# Patient Record
Sex: Male | Born: 1970 | Race: Black or African American | Hispanic: No | Smoking: Former smoker
Health system: Southern US, Community
[De-identification: ages and names within clinical notes are randomized; demographics above are authoritative.]

## PROBLEM LIST (undated history)

## (undated) DIAGNOSIS — I10 Essential (primary) hypertension: Secondary | ICD-10-CM

## (undated) DIAGNOSIS — G2581 Restless legs syndrome: Secondary | ICD-10-CM

## (undated) DIAGNOSIS — E119 Type 2 diabetes mellitus without complications: Secondary | ICD-10-CM

## (undated) DIAGNOSIS — E781 Pure hyperglyceridemia: Secondary | ICD-10-CM

## (undated) HISTORY — DX: Type 2 diabetes mellitus without complications: E11.9

## (undated) HISTORY — DX: Pure hyperglyceridemia: E78.1

## (undated) HISTORY — DX: Essential (primary) hypertension: I10

## (undated) HISTORY — DX: Restless legs syndrome: G25.81

---

## 1998-10-16 ENCOUNTER — Emergency Department (HOSPITAL_COMMUNITY): Admission: EM | Admit: 1998-10-16 | Discharge: 1998-10-16 | Payer: Self-pay | Admitting: Emergency Medicine

## 1999-01-15 ENCOUNTER — Emergency Department (HOSPITAL_COMMUNITY): Admission: EM | Admit: 1999-01-15 | Discharge: 1999-01-15 | Payer: Self-pay | Admitting: Emergency Medicine

## 1999-01-15 ENCOUNTER — Encounter: Payer: Self-pay | Admitting: Emergency Medicine

## 1999-02-03 ENCOUNTER — Emergency Department (HOSPITAL_COMMUNITY): Admission: EM | Admit: 1999-02-03 | Discharge: 1999-02-03 | Payer: Self-pay | Admitting: Emergency Medicine

## 1999-08-03 ENCOUNTER — Emergency Department (HOSPITAL_COMMUNITY): Admission: EM | Admit: 1999-08-03 | Discharge: 1999-08-03 | Payer: Self-pay | Admitting: Emergency Medicine

## 2000-06-13 ENCOUNTER — Emergency Department (HOSPITAL_COMMUNITY): Admission: EM | Admit: 2000-06-13 | Discharge: 2000-06-13 | Payer: Self-pay | Admitting: Internal Medicine

## 2000-06-17 ENCOUNTER — Emergency Department (HOSPITAL_COMMUNITY): Admission: EM | Admit: 2000-06-17 | Discharge: 2000-06-17 | Payer: Self-pay | Admitting: Emergency Medicine

## 2000-06-19 ENCOUNTER — Emergency Department (HOSPITAL_COMMUNITY): Admission: EM | Admit: 2000-06-19 | Discharge: 2000-06-19 | Payer: Self-pay | Admitting: Emergency Medicine

## 2000-07-21 ENCOUNTER — Emergency Department (HOSPITAL_COMMUNITY): Admission: EM | Admit: 2000-07-21 | Discharge: 2000-07-21 | Payer: Self-pay | Admitting: Emergency Medicine

## 2000-08-01 ENCOUNTER — Emergency Department (HOSPITAL_COMMUNITY): Admission: EM | Admit: 2000-08-01 | Discharge: 2000-08-01 | Payer: Self-pay | Admitting: Emergency Medicine

## 2002-01-18 ENCOUNTER — Emergency Department (HOSPITAL_COMMUNITY): Admission: EM | Admit: 2002-01-18 | Discharge: 2002-01-18 | Payer: Self-pay | Admitting: Emergency Medicine

## 2002-08-21 ENCOUNTER — Emergency Department (HOSPITAL_COMMUNITY): Admission: EM | Admit: 2002-08-21 | Discharge: 2002-08-21 | Payer: Self-pay | Admitting: Emergency Medicine

## 2003-10-02 ENCOUNTER — Emergency Department (HOSPITAL_COMMUNITY): Admission: EM | Admit: 2003-10-02 | Discharge: 2003-10-02 | Payer: Self-pay | Admitting: Emergency Medicine

## 2004-06-10 ENCOUNTER — Emergency Department (HOSPITAL_COMMUNITY): Admission: EM | Admit: 2004-06-10 | Discharge: 2004-06-10 | Payer: Self-pay | Admitting: Emergency Medicine

## 2007-10-13 ENCOUNTER — Emergency Department (HOSPITAL_COMMUNITY): Admission: EM | Admit: 2007-10-13 | Discharge: 2007-10-13 | Payer: Self-pay | Admitting: Emergency Medicine

## 2007-11-02 ENCOUNTER — Encounter: Admission: RE | Admit: 2007-11-02 | Discharge: 2007-11-02 | Payer: Self-pay | Admitting: Family Medicine

## 2011-09-29 ENCOUNTER — Ambulatory Visit (INDEPENDENT_AMBULATORY_CARE_PROVIDER_SITE_OTHER): Payer: BC Managed Care – PPO | Admitting: Family Medicine

## 2011-09-29 ENCOUNTER — Encounter: Payer: Self-pay | Admitting: Family Medicine

## 2011-09-29 VITALS — BP 130/80 | HR 72 | Temp 98.8°F | Ht 74.0 in | Wt 256.0 lb

## 2011-09-29 DIAGNOSIS — Z0000 Encounter for general adult medical examination without abnormal findings: Secondary | ICD-10-CM

## 2011-09-29 MED ORDER — HYDROCORTISONE 2.5 % RE CREA: TOPICAL_CREAM | Freq: Two times a day (BID) | RECTAL | Status: AC

## 2011-09-30 ENCOUNTER — Encounter: Payer: Self-pay | Admitting: Family Medicine

## 2011-09-30 ENCOUNTER — Other Ambulatory Visit (INDEPENDENT_AMBULATORY_CARE_PROVIDER_SITE_OTHER): Payer: BC Managed Care – PPO

## 2011-09-30 DIAGNOSIS — Z0000 Encounter for general adult medical examination without abnormal findings: Secondary | ICD-10-CM

## 2011-09-30 LAB — BASIC METABOLIC PANEL
Calcium: 8.9 mg/dL (ref 8.4–10.5)
GFR: 100.23 mL/min (ref >60.00)
Sodium: 139 mEq/L (ref 135–145)

## 2011-09-30 LAB — CBC WITH DIFFERENTIAL/PLATELET
Basophils Relative: 0.5 % (ref 0.0–3.0)
Eosinophils Absolute: 0.2 10*3/uL (ref 0.0–0.7)
Hemoglobin: 14.6 g/dL (ref 13.0–17.0)
MCHC: 33.3 g/dL (ref 30.0–36.0)
MCV: 96.4 fl (ref 78.0–100.0)
Monocytes Absolute: 0.5 10*3/uL (ref 0.1–1.0)
Neutro Abs: 2.6 10*3/uL (ref 1.4–7.7)
RBC: 4.53 Mil/uL (ref 4.22–5.81)

## 2011-09-30 LAB — LIPID PANEL
HDL: 39.6 mg/dL (ref >39.00)
Total CHOL/HDL Ratio: 5
VLDL: 22.4 mg/dL (ref 0.0–40.0)

## 2011-09-30 LAB — HEPATIC FUNCTION PANEL
Alkaline Phosphatase: 61 U/L (ref 39–117)
Bilirubin, Direct: 0 mg/dL (ref 0.0–0.3)

## 2011-09-30 NOTE — Progress Notes (Signed)
Quick Note:  I tried to reach pt by phone, no answer. I put a copy of results in mail. ______ 

## 2011-09-30 NOTE — Progress Notes (Signed)
  Subjective:    Patient ID: Jeffrey Todd, male    DOB: 08-01-1970, 41 y.o.   MRN: [id number]  HPI 41 yr old male to establish and for a cpx. He feels fine except for 2 issues. He has a long hx of hemorrhoids which swell and get painful from time to time. They may bleed on occasion. OTC meds like Preparation H do not help much. His BMs are easy to pass with no straining, and they are soft and not hard. However he does describe having frequent BMs, often 3 or 4 a day. He passes a lot of flatus and has a lot of bloating. No heartburn or GERD symptoms. Appetite is good. He thinks he is lactose intolerant because consuming a lot of dairy products causes more bloating and some diarrhea.    Review of Systems  Constitutional: Negative.   HENT: Negative.   Eyes: Negative.   Respiratory: Negative.   Cardiovascular: Negative.   Gastrointestinal: Positive for diarrhea, abdominal distention, anal bleeding and rectal pain. Negative for nausea, vomiting, abdominal pain, constipation and blood in stool.  Genitourinary: Negative.   Musculoskeletal: Negative.   Skin: Negative.   Neurological: Negative.   Hematological: Negative.   Psychiatric/Behavioral: Negative.        Objective:   Physical Exam  Constitutional: He is oriented to person, place, and time. He appears well-developed and well-nourished. No distress.  HENT:  Head: Normocephalic and atraumatic.  Right Ear: External ear normal.  Left Ear: External ear normal.  Nose: Nose normal.  Mouth/Throat: Oropharynx is clear and moist. No oropharyngeal exudate.  Eyes: Conjunctivae and EOM are normal. Pupils are equal, round, and reactive to light. Right eye exhibits no discharge. Left eye exhibits no discharge. No scleral icterus.  Neck: Neck supple. No JVD present. No tracheal deviation present. No thyromegaly present.  Cardiovascular: Normal rate, regular rhythm, normal heart sounds and intact distal pulses.  Exam reveals no gallop and no  friction rub.   No murmur heard. Pulmonary/Chest: Effort normal and breath sounds normal. No respiratory distress. He has no wheezes. He has no rales. He exhibits no tenderness.  Abdominal: Soft. Bowel sounds are normal. He exhibits no distension and no mass. There is no tenderness. There is no rebound and no guarding.  Genitourinary: Prostate normal and penis normal. Guaiac negative stool. No penile tenderness.       Several small non-tender external hemorrhoids   Musculoskeletal: Normal range of motion. He exhibits no edema and no tenderness.  Lymphadenopathy:    He has no cervical adenopathy.  Neurological: He is alert and oriented to person, place, and time. He has normal reflexes. No cranial nerve deficit. He exhibits normal muscle tone. Coordination normal.  Skin: Skin is warm and dry. No rash noted. He is not diaphoretic. No erythema. No pallor.  Psychiatric: He has a normal mood and affect. His behavior is normal. Judgment and thought content normal.          Assessment & Plan:  Well exam. Set up fasting labs soon. He will limit his intake of dairy products. He may have some IBS. Try taking Align probiotics daily. Use Anusol HC prn for the hemorrhoids

## 2011-12-07 ENCOUNTER — Ambulatory Visit: Payer: BC Managed Care – PPO | Admitting: Family Medicine

## 2011-12-14 ENCOUNTER — Ambulatory Visit (INDEPENDENT_AMBULATORY_CARE_PROVIDER_SITE_OTHER): Payer: BC Managed Care – PPO | Admitting: Family Medicine

## 2011-12-14 ENCOUNTER — Encounter: Payer: Self-pay | Admitting: Family Medicine

## 2011-12-14 VITALS — BP 140/90 | Temp 98.3°F | Wt 256.0 lb

## 2011-12-14 DIAGNOSIS — R51 Headache: Secondary | ICD-10-CM

## 2011-12-14 DIAGNOSIS — R030 Elevated blood-pressure reading, without diagnosis of hypertension: Secondary | ICD-10-CM

## 2011-12-14 DIAGNOSIS — IMO0001 Reserved for inherently not codable concepts without codable children: Secondary | ICD-10-CM

## 2011-12-14 MED ORDER — CYCLOBENZAPRINE HCL 10 MG PO TABS: 10.0000 mg | ORAL_TABLET | Freq: Three times a day (TID) | ORAL | Status: AC | PRN

## 2011-12-14 MED ORDER — DICLOFENAC SODIUM 75 MG PO TBEC: 75.0000 mg | DELAYED_RELEASE_TABLET | Freq: Two times a day (BID) | ORAL | Status: AC | PRN

## 2011-12-14 NOTE — Progress Notes (Signed)
  Subjective:    Patient ID: Jeffrey Todd, male    DOB: 05-09-1970, 41 y.o.   MRN: [id number]  HPI Here for 2 weeks of intermittent mild HAs in the back of the head. He also has stiffness in the neck lately. 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 No vision problems. Advil makes the HAs stop. He does not have a hx of HTN.    Review of Systems  Constitutional: Negative.   Eyes: Negative.   Cardiovascular: Negative.   Neurological: Positive for headaches.       Objective:   Physical Exam  Constitutional: He is oriented to person, place, and time. He appears well-developed and well-nourished.  Eyes: Pupils are equal, round, and reactive to light.  Cardiovascular: Normal rate, regular rhythm, normal heart sounds and intact distal pulses.   Pulmonary/Chest: Effort normal and breath sounds normal.  Neurological: He is alert and oriented to person, place, and time.          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 Try Diclofenac and Flexeril prn. I do not think these are related to BP but I asked him to monitor this closely.

## 2012-01-11 ENCOUNTER — Ambulatory Visit (INDEPENDENT_AMBULATORY_CARE_PROVIDER_SITE_OTHER): Payer: BC Managed Care – PPO | Admitting: Family Medicine

## 2012-01-11 ENCOUNTER — Encounter: Payer: Self-pay | Admitting: Family Medicine

## 2012-01-11 VITALS — BP 136/88 | HR 75 | Temp 98.7°F | Wt 258.0 lb

## 2012-01-11 DIAGNOSIS — R739 Hyperglycemia, unspecified: Secondary | ICD-10-CM

## 2012-01-11 DIAGNOSIS — R51 Headache: Secondary | ICD-10-CM

## 2012-01-11 DIAGNOSIS — R7309 Other abnormal glucose: Secondary | ICD-10-CM

## 2012-01-11 LAB — BASIC METABOLIC PANEL
Calcium: 9.2 mg/dL (ref 8.4–10.5)
Creatinine, Ser: 1.1 mg/dL (ref 0.4–1.5)
GFR: 92.96 mL/min (ref >60.00)
Glucose, Bld: 100 mg/dL — ABNORMAL HIGH (ref 70–99)
Sodium: 136 mEq/L (ref 135–145)

## 2012-01-11 NOTE — Progress Notes (Signed)
  Subjective:    Patient ID: Jeffrey Todd, male    DOB: [birth year], 41 y.o.   MRN: [id number]  HPI Here to follow up on HAs. We saw him last month for these and prescribed flexeril and Diclofenac. The Diclofenac helps for awhile but the HAs comes back . He cannot take Flexeril at all during the work week because of the sedation it causes. No blurred vision or nausea or neurologic deficits. The HAs are still in the back of the head and sometimes on the top of the head.   Review of Systems  Constitutional: Negative.   Eyes: Negative.   Respiratory: Negative.   Cardiovascular: Negative.   Neurological: Positive for headaches. Negative for dizziness, tremors, seizures, syncope, facial asymmetry, speech difficulty, weakness, light-headedness and numbness.       Objective:   Physical Exam  Constitutional: He is oriented to person, place, and time. He appears well-developed and well-nourished.  HENT:  Head: Normocephalic and atraumatic.  Right Ear: External ear normal.  Left Ear: External ear normal.  Nose: Nose normal.  Mouth/Throat: Oropharynx is clear and moist.  Eyes: Conjunctivae and EOM are normal. Pupils are equal, round, and reactive to light.  Neck: Normal range of motion. Neck supple. No thyromegaly present.  Lymphadenopathy:    He has no cervical adenopathy.  Neurological: He is alert and oriented to person, place, and time. He has normal reflexes. No cranial nerve deficit. He exhibits normal muscle tone. Coordination normal.          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 Stay on Flexeril and Diclofenac but take these regularly and not just prn. Set up a head CT soon. Written out of work today thorough 01-13-12.

## 2012-01-12 ENCOUNTER — Ambulatory Visit (INDEPENDENT_AMBULATORY_CARE_PROVIDER_SITE_OTHER)
Admission: RE | Admit: 2012-01-12 | Discharge: 2012-01-12 | Disposition: A | Discharge status: Home / Self Care | Payer: BC Managed Care – PPO | Source: Ambulatory Visit | Attending: Family Medicine | Admitting: Family Medicine

## 2012-01-12 DIAGNOSIS — R51 Headache: Secondary | ICD-10-CM

## 2012-01-13 ENCOUNTER — Telehealth: Payer: Self-pay | Admitting: Family Medicine

## 2012-01-13 NOTE — Telephone Encounter (Signed)
Normal (see the report)

## 2012-01-13 NOTE — Telephone Encounter (Signed)
Pt requesting results of CT scan done on yesterday.

## 2012-01-13 NOTE — Progress Notes (Signed)
Quick Note:  I spoke with pt ______ 

## 2012-01-13 NOTE — Telephone Encounter (Signed)
I spoke with pt and gave results of scan and labs.

## 2012-09-23 ENCOUNTER — Ambulatory Visit (INDEPENDENT_AMBULATORY_CARE_PROVIDER_SITE_OTHER): Payer: BC Managed Care – PPO | Admitting: Family Medicine

## 2012-09-23 ENCOUNTER — Encounter: Payer: Self-pay | Admitting: Family Medicine

## 2012-09-23 VITALS — BP 154/88 | HR 77 | Temp 98.9°F | Wt 261.0 lb

## 2012-09-23 DIAGNOSIS — Z2089 Contact with and (suspected) exposure to other communicable diseases: Secondary | ICD-10-CM

## 2012-09-23 DIAGNOSIS — K649 Unspecified hemorrhoids: Secondary | ICD-10-CM

## 2012-09-23 DIAGNOSIS — Z202 Contact with and (suspected) exposure to infections with a predominantly sexual mode of transmission: Secondary | ICD-10-CM

## 2012-09-23 MED ORDER — METRONIDAZOLE 500 MG PO TABS: 500.0000 mg | ORAL_TABLET | Freq: Three times a day (TID) | ORAL | Status: DC

## 2012-09-23 NOTE — Progress Notes (Signed)
  Subjective:    Patient ID: Jeffrey Todd, male    DOB: November 12, 1970, 42 y.o.   MRN: [id number]  HPI Here to ask about hemorrhoids. His are getting worse and now he bleeds with every BM. Also his girlfriend was recently diagnosed with Trichomonas, and he was told to see me. He has no symptoms.    Review of Systems  Constitutional: Negative.   Gastrointestinal: Positive for anal bleeding.       Objective:   Physical Exam  Constitutional: He appears well-developed and well-nourished.          Assessment & Plan:  Refer to Surgery for the hemorrhoids. Cover with Flagyl.

## 2012-10-03 ENCOUNTER — Ambulatory Visit (INDEPENDENT_AMBULATORY_CARE_PROVIDER_SITE_OTHER): Payer: BC Managed Care – PPO | Admitting: General Surgery

## 2012-10-03 ENCOUNTER — Encounter (INDEPENDENT_AMBULATORY_CARE_PROVIDER_SITE_OTHER): Payer: Self-pay | Admitting: General Surgery

## 2012-10-03 VITALS — BP 130/76 | HR 68 | Temp 97.6°F | Resp 16 | Ht 74.0 in | Wt 260.8 lb

## 2012-10-03 DIAGNOSIS — K649 Unspecified hemorrhoids: Secondary | ICD-10-CM

## 2012-10-03 DIAGNOSIS — K648 Other hemorrhoids: Secondary | ICD-10-CM

## 2012-10-03 MED ORDER — HYDROCORTISONE ACETATE 25 MG RE SUPP: 25.0000 mg | Freq: Every day | RECTAL | Status: DC

## 2012-10-03 NOTE — Progress Notes (Signed)
Chief Complaint  Patient presents with  . New Evaluation    eval hems    HISTORY: Jeffrey Todd is a 42 y.o. male who presents to the office with rectal bleeding with every BM for the last few weeks.  Other symptoms include prolapsing tissue.  This had been occurring for several years intermittently.  He has tried hemorrhoid cream in the past with some success but doesn't help now.  Nothing makes the symptoms worse.   It is continuous with bm's at the present.  His bowel habits are regular and his bowel movements are soft.  His fiber intake is min.  He has never had a colonoscopy.     Past Medical History  Diagnosis Date  . Hemorrhoids       History reviewed. No pertinent past surgical history.      Current Outpatient Prescriptions  Medication Sig Dispense Refill  . aspirin 81 MG tablet Take 81 mg by mouth daily.      . cyclobenzaprine (FLEXERIL) 10 MG tablet Take 10 mg by mouth 3 (three) times daily as needed.      . diclofenac (VOLTAREN) 75 MG EC tablet Take 1 tablet (75 mg total) by mouth 2 (two) times daily as needed (pain).  60 tablet  2  . hydrocortisone (ANUSOL-HC) 25 MG suppository Place 1 suppository (25 mg total) rectally at bedtime.  12 suppository  1  . metroNIDAZOLE (FLAGYL) 500 MG tablet Take 1 tablet (500 mg total) by mouth 3 (three) times daily.  21 tablet  0   No current facility-administered medications for this visit.      No Known Allergies    FH: GF with colon cancer  History   Social History  . Marital Status: Divorced    Spouse Name: N/A    Number of Children: N/A  . Years of Education: N/A   Social History Main Topics  . Smoking status: Former Smoker    Quit date: 05/04/2004  . Smokeless tobacco: Never Used  . Alcohol Use: 0.5 oz/week    1 drink(s) per week  . Drug Use: No  . Sexually Active: None   Other Topics Concern  . None   Social History Narrative  . None      REVIEW OF SYSTEMS - PERTINENT POSITIVES ONLY: Review of Systems -  General ROS: negative for - chills, fever or weight loss Hematological and Lymphatic ROS: negative for - bleeding problems, blood clots or bruising Respiratory ROS: no cough, shortness of breath, or wheezing Cardiovascular ROS: no chest pain or dyspnea on exertion Gastrointestinal ROS: positive for - blood in stools, constipation, diarrhea and nausea/vomiting negative for - abdominal pain, change in bowel habits or nausea/vomiting Genito-Urinary ROS: no dysuria, trouble voiding, or hematuria  EXAM: Filed Vitals:   10/03/12 1327  BP: 130/76  Pulse: 68  Temp: 97.6 F (36.4 C)  Resp: 16    General appearance: alert and cooperative Resp: clear to auscultation bilaterally Cardio: regular rate and rhythm GI: soft, non-tender; bowel sounds normal; no masses,  no organomegaly   Procedure: Anoscopy Surgeon: Maisie Fus Diagnosis: rectal bleeding  Assistant: Christella Scheuermann After the risks and benefits were explained, verbal consent was obtained for above procedure  Anesthesia: none Findings: Grade 3 L lateral hemorrhoid, min hemorrhoid disease on the right side    ASSESSMENT AND PLAN: Jeffrey Todd is a 42 y.o. M with bleeding per rectum.  We will start him on a fiber supplement and Anusol suppository nightly.  I will  see him back in 4 weeks.  If his bleeding is still ongoing,  I think he would be a good candidate for banding in the office.  Given his FH of colon cancer, I may need to do a colonoscopy if the bleeding does not completely respond to hemorrhoid treatments.    Jeffrey Panda, MD Colon and Rectal Surgery / General Surgery [facility]      Visit Diagnoses: 1. Internal bleeding hemorrhoids   2. Hemorrhoids     Primary Care Physician: Jeffrey Salisbury, MD

## 2012-10-03 NOTE — Patient Instructions (Signed)

## 2012-11-01 ENCOUNTER — Ambulatory Visit (INDEPENDENT_AMBULATORY_CARE_PROVIDER_SITE_OTHER): Payer: BC Managed Care – PPO | Admitting: General Surgery

## 2012-11-07 ENCOUNTER — Ambulatory Visit (INDEPENDENT_AMBULATORY_CARE_PROVIDER_SITE_OTHER): Payer: BC Managed Care – PPO | Admitting: General Surgery

## 2012-11-16 ENCOUNTER — Ambulatory Visit (INDEPENDENT_AMBULATORY_CARE_PROVIDER_SITE_OTHER): Payer: BC Managed Care – PPO | Admitting: General Surgery

## 2013-01-17 ENCOUNTER — Ambulatory Visit (INDEPENDENT_AMBULATORY_CARE_PROVIDER_SITE_OTHER): Payer: BC Managed Care – PPO | Admitting: General Surgery

## 2013-01-30 ENCOUNTER — Encounter (INDEPENDENT_AMBULATORY_CARE_PROVIDER_SITE_OTHER): Payer: Self-pay | Admitting: General Surgery

## 2013-06-30 ENCOUNTER — Encounter: Payer: Self-pay | Admitting: Family Medicine

## 2013-06-30 ENCOUNTER — Ambulatory Visit (INDEPENDENT_AMBULATORY_CARE_PROVIDER_SITE_OTHER): Payer: BC Managed Care – PPO | Admitting: Family Medicine

## 2013-06-30 VITALS — BP 138/86 | HR 76 | Temp 98.7°F | Ht 74.0 in | Wt 262.0 lb

## 2013-06-30 DIAGNOSIS — M545 Low back pain, unspecified: Secondary | ICD-10-CM

## 2013-06-30 DIAGNOSIS — M79604 Pain in right leg: Secondary | ICD-10-CM

## 2013-06-30 MED ORDER — DICLOFENAC SODIUM 75 MG PO TBEC: 75.0000 mg | DELAYED_RELEASE_TABLET | Freq: Two times a day (BID) | ORAL | Status: DC

## 2013-06-30 NOTE — Progress Notes (Signed)
   Subjective:    Patient ID: Jeffrey Todd, male    DOB: 1971-01-27, 43 y.o.   MRN: [id number]  HPI Here for 2 years of low back pain that radiates down the right leg through the buttock. This is steadily getting worse. The leg goes numb and gets weak at times. Using heat, Ibuprofen, and Flexeril.    Review of Systems  Constitutional: Negative.   Musculoskeletal: Positive for back pain.       Objective:   Physical Exam  Constitutional: He appears well-developed and well-nourished.  Musculoskeletal:  Tender over the right sciatic notch, full ROM. Positive SLR on the right          Assessment & Plan:  Try Diclofenac. Set up an MRI scan.

## 2013-06-30 NOTE — Progress Notes (Signed)
Pre visit review using our clinic review tool, if applicable. No additional management support is needed unless otherwise documented below in the visit note. 

## 2013-07-14 ENCOUNTER — Ambulatory Visit
Admission: RE | Admit: 2013-07-14 | Discharge: 2013-07-14 | Disposition: A | Discharge status: Home / Self Care | Payer: BC Managed Care – PPO | Source: Ambulatory Visit | Attending: Family Medicine | Admitting: Family Medicine

## 2013-07-14 DIAGNOSIS — M545 Low back pain: Secondary | ICD-10-CM

## 2013-07-14 DIAGNOSIS — M79604 Pain in right leg: Secondary | ICD-10-CM

## 2013-07-22 ENCOUNTER — Ambulatory Visit
Admission: RE | Admit: 2013-07-22 | Discharge: 2013-07-22 | Disposition: A | Discharge status: Home / Self Care | Payer: BC Managed Care – PPO | Source: Ambulatory Visit | Attending: Family Medicine | Admitting: Family Medicine

## 2013-07-26 ENCOUNTER — Telehealth: Payer: Self-pay | Admitting: Family Medicine

## 2013-07-26 NOTE — Telephone Encounter (Signed)
Pt would like results of mri done sat am

## 2013-07-27 NOTE — Addendum Note (Signed)
Addended by: Gershon CraneFRY, Zahari Xiang A on: 07/27/2013 01:23 PM   Modules accepted: Orders

## 2013-07-27 NOTE — Telephone Encounter (Signed)
See my note

## 2013-07-27 NOTE — Telephone Encounter (Signed)
I spoke with pt and gave results.  

## 2013-08-29 ENCOUNTER — Telehealth: Payer: Self-pay | Admitting: Family Medicine

## 2013-08-29 NOTE — Telephone Encounter (Signed)
Pt is seeing dr cram NS for back pain. Pt was sch for surgery on 09-18-13 but cancelled due to will try injections. Pt would like new rx hydrocodone.

## 2013-08-29 NOTE — Telephone Encounter (Signed)
Pt only saw Dr. Wynetta Emeryram once and he is waiting to here back about possible cortisone injections. He said that he had taken hydrocodone in the past, willing to try any recommendation that would help with the back pain.

## 2013-08-29 NOTE — Telephone Encounter (Signed)
I have never given him hydrocodone for this. He is seeing Dr. Wynetta Emeryram for the problem so I suggest he ask Dr. Wynetta Emeryram for pain meds.

## 2013-08-29 NOTE — Telephone Encounter (Signed)
Per Dr. Clent RidgesFry, he prescribed Diclofenac to help with the inflammation, if pt is having pain the he will need to schedule a office visit here. I spoke with pt and he declined the visit.

## 2014-04-11 ENCOUNTER — Encounter: Payer: Self-pay | Admitting: Family

## 2014-04-11 ENCOUNTER — Ambulatory Visit (INDEPENDENT_AMBULATORY_CARE_PROVIDER_SITE_OTHER): Payer: BC Managed Care – PPO | Admitting: Family

## 2014-04-11 VITALS — BP 118/76 | Temp 98.1°F | Wt 255.0 lb

## 2014-04-11 DIAGNOSIS — Z23 Encounter for immunization: Secondary | ICD-10-CM

## 2014-04-11 DIAGNOSIS — W540XXA Bitten by dog, initial encounter: Secondary | ICD-10-CM

## 2014-04-11 DIAGNOSIS — S60811A Abrasion of right wrist, initial encounter: Secondary | ICD-10-CM

## 2014-04-11 DIAGNOSIS — T148 Other injury of unspecified body region: Secondary | ICD-10-CM

## 2014-04-11 MED ORDER — AMOXICILLIN-POT CLAVULANATE 875-125 MG PO TABS: 1.0000 | ORAL_TABLET | Freq: Two times a day (BID) | ORAL | Status: DC

## 2014-04-11 NOTE — Patient Instructions (Signed)

## 2014-04-11 NOTE — Progress Notes (Signed)
Pre visit review using our clinic review tool, if applicable. No additional management support is needed unless otherwise documented below in the visit note. 

## 2014-04-11 NOTE — Progress Notes (Signed)
Subjective:    Patient ID: Jeffrey Todd, male    DOB: Jan 15, 1971, 43 y.o.   MRN: [id number]  HPI Nonsmoker is in today after a dog bite that occurred this morning. Patient reports letting his dog out this morning, meanwhile his dog was an unprovoked attack with a pit bull.  He was subsequently bit by his own dog. Reports his dog had shots yesterday. His puppy is 163 months old. Police the animal control has been notified. Has a swollen right wrist and superficial scrapes.  pain as a 4 out of 10. No decrease in range of motion. Last Tdap unknown.    Review of Systems  Constitutional: Negative.   Respiratory: Negative.   Cardiovascular: Negative.   Gastrointestinal: Negative.   Endocrine: Negative.   Genitourinary: Negative.   Musculoskeletal: Negative.   Skin: Positive for wound.       Right wrist: dog bite. No active bleeding.   Neurological: Negative.    Past Medical History  Diagnosis Date  . Hemorrhoids     History   Social History  . Marital Status: Divorced    Spouse Name: N/A    Number of Children: N/A  . Years of Education: N/A   Occupational History  . Not on file.   Social History Main Topics  . Smoking status: Former Smoker    Quit date: 05/04/2004  . Smokeless tobacco: Never Used  . Alcohol Use: 0.5 oz/week    1 drink(s) per week  . Drug Use: No  . Sexual Activity: Not on file   Other Topics Concern  . Not on file   Social History Narrative    History reviewed. No pertinent past surgical history.  History reviewed. No pertinent family history.  No Known Allergies  Current Outpatient Prescriptions on File Prior to Visit  Medication Sig Dispense Refill  . diclofenac (VOLTAREN) 75 MG EC tablet Take 1 tablet (75 mg total) by mouth 2 (two) times daily. 60 tablet 5   No current facility-administered medications on file prior to visit.    BP 118/76 mmHg  Temp(Src) 98.1 F (36.7 C) (Oral)  Wt 255 lb (115.667 kg)chart    Objective:   Physical  Exam  Constitutional: He is oriented to person, place, and time. He appears well-developed and well-nourished.  HENT:  Right Ear: External ear normal.  Left Ear: External ear normal.  Nose: Nose normal.  Mouth/Throat: Oropharynx is clear and moist.  Neck: Normal range of motion. Neck supple.  Cardiovascular: Normal rate, regular rhythm and normal heart sounds.   Pulmonary/Chest: Effort normal and breath sounds normal.  Musculoskeletal: Normal range of motion.  ROM of the right wrist normal.   Neurological: He is alert and oriented to person, place, and time.  Skin: Skin is warm and dry.  Right wrist: superficial abrasions noted to the right wrist. No obvious puncture wound.Mild swelling.   Psychiatric: He has a normal mood and affect.          Assessment & Plan:  Jeffrey Todd was seen today for animal bite.  Diagnoses and associated orders for this visit:  Dog bite - Tdap vaccine greater than or equal to 7yo IM  Other Orders - amoxicillin-clavulanate (AUGMENTIN) 875-125 MG per tablet; Take 1 tablet by mouth 2 (two) times daily.    Will treat with Augmentin 875 mg twice a day. Recheck in 5 days. Keep wound clean and dry. Discussed s/s of infection. Patient to follow-up with animal control and police as scheduled.

## 2014-04-17 ENCOUNTER — Ambulatory Visit: Payer: BC Managed Care – PPO | Admitting: Family Medicine

## 2014-05-25 ENCOUNTER — Other Ambulatory Visit: Payer: BC Managed Care – PPO

## 2014-05-28 ENCOUNTER — Other Ambulatory Visit: Payer: Self-pay | Admitting: *Deleted

## 2014-05-28 DIAGNOSIS — Z0000 Encounter for general adult medical examination without abnormal findings: Secondary | ICD-10-CM

## 2014-05-29 ENCOUNTER — Other Ambulatory Visit: Payer: Self-pay

## 2014-05-29 ENCOUNTER — Ambulatory Visit (INDEPENDENT_AMBULATORY_CARE_PROVIDER_SITE_OTHER): Payer: BLUE CROSS/BLUE SHIELD | Admitting: Family Medicine

## 2014-05-29 ENCOUNTER — Encounter: Payer: Self-pay | Admitting: Family Medicine

## 2014-05-29 VITALS — BP 141/81 | HR 71 | Temp 98.2°F | Ht 74.0 in | Wt 257.0 lb

## 2014-05-29 DIAGNOSIS — Z0000 Encounter for general adult medical examination without abnormal findings: Secondary | ICD-10-CM

## 2014-05-29 DIAGNOSIS — Z125 Encounter for screening for malignant neoplasm of prostate: Secondary | ICD-10-CM

## 2014-05-29 LAB — LIPID PANEL
CHOL/HDL RATIO: 5
Cholesterol: 207 mg/dL — ABNORMAL HIGH (ref 0–200)
HDL: 39.8 mg/dL (ref >39.00)
LDL Cholesterol: 140 mg/dL — ABNORMAL HIGH (ref 0–99)
NonHDL: 167.2
TRIGLYCERIDES: 137 mg/dL (ref 0.0–149.0)
VLDL: 27.4 mg/dL (ref 0.0–40.0)

## 2014-05-29 LAB — HEPATIC FUNCTION PANEL
ALK PHOS: 64 U/L (ref 39–117)
ALT: 24 U/L (ref 0–53)
AST: 18 U/L (ref 0–37)
Albumin: 4.1 g/dL (ref 3.5–5.2)
Bilirubin, Direct: 0.1 mg/dL (ref 0.0–0.3)
Total Bilirubin: 0.6 mg/dL (ref 0.2–1.2)
Total Protein: 7.5 g/dL (ref 6.0–8.3)

## 2014-05-29 LAB — BASIC METABOLIC PANEL
BUN: 11 mg/dL (ref 6–23)
CALCIUM: 9.3 mg/dL (ref 8.4–10.5)
CO2: 28 meq/L (ref 19–32)
Chloride: 102 mEq/L (ref 96–112)
Creatinine, Ser: 1.11 mg/dL (ref 0.40–1.50)
GFR: 93.76 mL/min (ref >60.00)
Glucose, Bld: 107 mg/dL — ABNORMAL HIGH (ref 70–99)
POTASSIUM: 4.3 meq/L (ref 3.5–5.1)
Sodium: 138 mEq/L (ref 135–145)

## 2014-05-29 LAB — CBC WITH DIFFERENTIAL/PLATELET
Basophils Absolute: 0 10*3/uL (ref 0.0–0.1)
Basophils Relative: 0.4 % (ref 0.0–3.0)
Eosinophils Absolute: 0.2 10*3/uL (ref 0.0–0.7)
Eosinophils Relative: 4.1 % (ref 0.0–5.0)
HCT: 43.5 % (ref 39.0–52.0)
Hemoglobin: 15 g/dL (ref 13.0–17.0)
LYMPHS ABS: 2.2 10*3/uL (ref 0.7–4.0)
Lymphocytes Relative: 37.7 % (ref 12.0–46.0)
MCHC: 34.6 g/dL (ref 30.0–36.0)
MCV: 91.6 fl (ref 78.0–100.0)
MONOS PCT: 10 % (ref 3.0–12.0)
Monocytes Absolute: 0.6 10*3/uL (ref 0.1–1.0)
Neutro Abs: 2.8 10*3/uL (ref 1.4–7.7)
Neutrophils Relative %: 47.8 % (ref 43.0–77.0)
Platelets: 284 10*3/uL (ref 150.0–400.0)
RBC: 4.75 Mil/uL (ref 4.22–5.81)
RDW: 12.5 % (ref 11.5–15.5)
WBC: 5.8 10*3/uL (ref 4.0–10.5)

## 2014-05-29 LAB — POCT URINALYSIS DIPSTICK
Bilirubin, UA: NEGATIVE
GLUCOSE UA: NEGATIVE
KETONES UA: NEGATIVE
Leukocytes, UA: NEGATIVE
NITRITE UA: NEGATIVE
Protein, UA: NEGATIVE
Spec Grav, UA: 1.02
UROBILINOGEN UA: 0.2
pH, UA: 7

## 2014-05-29 LAB — PSA: PSA: 0.6 ng/mL (ref 0.10–4.00)

## 2014-05-29 LAB — TSH: TSH: 1.09 u[IU]/mL (ref 0.35–4.50)

## 2014-05-29 MED ORDER — DICLOFENAC SODIUM 75 MG PO TBEC: 75.0000 mg | DELAYED_RELEASE_TABLET | Freq: Two times a day (BID) | ORAL | Status: DC

## 2014-05-29 MED ORDER — HYDROCODONE-ACETAMINOPHEN 5-325 MG PO TABS: 1.0000 | ORAL_TABLET | Freq: Four times a day (QID) | ORAL | Status: DC | PRN

## 2014-05-29 NOTE — Progress Notes (Signed)
Pre visit review using our clinic review tool, if applicable. No additional management support is needed unless otherwise documented below in the visit note. 

## 2014-05-29 NOTE — Progress Notes (Signed)
   Subjective:    Patient ID: Jeffrey Todd, male    DOB: 05/30/1970, 44 y.o.   MRN: [id number]  HPI 44 yr old male for a cpx. He feels well except for some low back pain that comes and goes.    Review of Systems  Constitutional: Negative.   HENT: Negative.   Eyes: Negative.   Respiratory: Negative.   Cardiovascular: Negative.   Gastrointestinal: Negative.   Genitourinary: Negative.   Musculoskeletal: Positive for back pain. Negative for myalgias, joint swelling, arthralgias, gait problem, neck pain and neck stiffness.  Skin: Negative.   Neurological: Negative.   Psychiatric/Behavioral: Negative.        Objective:   Physical Exam  Constitutional: He is oriented to person, place, and time. He appears well-developed and well-nourished. No distress.  HENT:  Head: Normocephalic and atraumatic.  Right Ear: External ear normal.  Left Ear: External ear normal.  Nose: Nose normal.  Mouth/Throat: Oropharynx is clear and moist. No oropharyngeal exudate.  Eyes: Conjunctivae and EOM are normal. Pupils are equal, round, and reactive to light. Right eye exhibits no discharge. Left eye exhibits no discharge. No scleral icterus.  Neck: Neck supple. No JVD present. No tracheal deviation present. No thyromegaly present.  Cardiovascular: Normal rate, regular rhythm, normal heart sounds and intact distal pulses.  Exam reveals no gallop and no friction rub.   No murmur heard. Pulmonary/Chest: Effort normal and breath sounds normal. No respiratory distress. He has no wheezes. He has no rales. He exhibits no tenderness.  Abdominal: Soft. Bowel sounds are normal. He exhibits no distension and no mass. There is no tenderness. There is no rebound and no guarding.  Genitourinary: Rectum normal, prostate normal and penis normal. Guaiac negative stool. No penile tenderness.  Musculoskeletal: Normal range of motion. He exhibits no edema or tenderness.  Lymphadenopathy:    He has no cervical adenopathy.    Neurological: He is alert and oriented to person, place, and time. He has normal reflexes. No cranial nerve deficit. He exhibits normal muscle tone. Coordination normal.  Skin: Skin is warm and dry. No rash noted. He is not diaphoretic. No erythema. No pallor.  Psychiatric: He has a normal mood and affect. His behavior is normal. Judgment and thought content normal.          Assessment & Plan:  Well exam. Get fasting labs.

## 2014-10-31 ENCOUNTER — Encounter: Payer: Self-pay | Admitting: Family Medicine

## 2014-10-31 ENCOUNTER — Ambulatory Visit (INDEPENDENT_AMBULATORY_CARE_PROVIDER_SITE_OTHER): Payer: BLUE CROSS/BLUE SHIELD | Admitting: Family Medicine

## 2014-10-31 VITALS — BP 137/83 | HR 85 | Temp 98.7°F | Ht 74.0 in | Wt 248.0 lb

## 2014-10-31 DIAGNOSIS — F411 Generalized anxiety disorder: Secondary | ICD-10-CM

## 2014-10-31 MED ORDER — ESCITALOPRAM OXALATE 10 MG PO TABS: 10.0000 mg | ORAL_TABLET | Freq: Every day | ORAL | Status: DC

## 2014-10-31 NOTE — Progress Notes (Signed)
   Subjective:    Patient ID: Jeffrey Todd, male    DOB: November 18, 1970, 44 y.o.   MRN: [id number]  HPI Here to discuss anxiety. He has been under stress lately with several issues, such as job stress, dental issues, and the recent death of a family member. He recently had a tooth pulled out and still has dental pain he is dealing with. He feels anxiou all the time, worries about things, he can't relax or sleep. He denies depression sx.    Review of Systems  Constitutional: Negative.   Psychiatric/Behavioral: Positive for sleep disturbance and decreased concentration. Negative for hallucinations, behavioral problems, confusion, dysphoric mood and agitation. The patient is nervous/anxious. The patient is not hyperactive.        Objective:   Physical Exam  Constitutional: He is oriented to person, place, and time. He appears well-developed and well-nourished.  Cardiovascular: Normal rate, regular rhythm, normal heart sounds and intact distal pulses.   Pulmonary/Chest: Effort normal and breath sounds normal.  Neurological: He is alert and oriented to person, place, and time.  Psychiatric: His behavior is normal. Thought content normal.  Anxious           Assessment & Plan:  Anxiety. Try Lexapro 10 mg daily and recheck in 3-4 weeks.

## 2014-10-31 NOTE — Progress Notes (Signed)
Pre visit review using our clinic review tool, if applicable. No additional management support is needed unless otherwise documented below in the visit note. 

## 2014-11-01 ENCOUNTER — Other Ambulatory Visit: Payer: Self-pay | Admitting: Family Medicine

## 2014-11-01 ENCOUNTER — Encounter: Payer: Self-pay | Admitting: Family Medicine

## 2014-11-01 DIAGNOSIS — F411 Generalized anxiety disorder: Secondary | ICD-10-CM | POA: Insufficient documentation

## 2014-11-01 MED ORDER — LORAZEPAM 1 MG PO TABS: 1.0000 mg | ORAL_TABLET | Freq: Three times a day (TID) | ORAL | Status: DC | PRN

## 2014-11-01 NOTE — Telephone Encounter (Signed)
Pt was seen on 6-29 and just started generic lexapro and it is not working. Please advise

## 2014-11-01 NOTE — Telephone Encounter (Signed)
As I told him. Lexapro will take a week or two to become therapuetic. In the meantime if he wishes he can use Ativan in addition to the Lexapro. Call in Ativan 1 mg tid prn anxiety, #60 with no rf

## 2014-11-01 NOTE — Telephone Encounter (Signed)
Called and spoke with pt and pt is aware of Dr. Claris CheFry's recommendations.  Rx called in to [facility] [address]

## 2014-12-27 ENCOUNTER — Other Ambulatory Visit: Payer: Self-pay | Admitting: Family Medicine

## 2014-12-28 NOTE — Telephone Encounter (Signed)
Call in #[phone] with 5 rf 

## 2015-03-05 ENCOUNTER — Telehealth: Payer: Self-pay | Admitting: Family Medicine

## 2015-03-05 NOTE — Telephone Encounter (Signed)
Patient Name: Jeffrey Todd  DOB: Feb 04, 1971    Initial Comment Caller states he is having chest pain.   Nurse Assessment  Nurse: Scarlette ArStandifer, RN, Heather Date/Time (Eastern Time): 03/05/2015 1:21:03 PM  Confirm and document reason for call. If symptomatic, describe symptoms. ---Caller states that he started with left side chest pain off and on for the last few days. No other symptoms  Has the patient traveled out of the country within the last 30 days? ---Not Applicable  Does the patient have any new or worsening symptoms? ---Yes  Will a triage be completed? ---Yes  Related visit to physician within the last 2 weeks? ---No  Does the PT have any chronic conditions? (i.e. diabetes, asthma, etc.) ---No     Guidelines    Guideline Title Affirmed Question Affirmed Notes  Chest Pain [1] Chest pain lasting <= 5 minutes AND [2] NO chest pain or cardiac symptoms now (Exceptions: pains lasting a few seconds)    Final Disposition User   See Physician within 24 Hours Standifer, RN, Research scientist (physical sciences)Heather    Comments  Appt made with Dr. Clent RidgesFry for tomorrow at 10:30 am.   Referrals  REFERRED TO PCP OFFICE   Disagree/Comply: Comply

## 2015-03-05 NOTE — Telephone Encounter (Signed)
Noted  

## 2015-03-06 ENCOUNTER — Ambulatory Visit: Payer: Self-pay | Admitting: Family Medicine

## 2015-03-08 ENCOUNTER — Encounter: Payer: Self-pay | Admitting: Family Medicine

## 2015-03-08 ENCOUNTER — Ambulatory Visit (INDEPENDENT_AMBULATORY_CARE_PROVIDER_SITE_OTHER): Payer: 59 | Admitting: Family Medicine

## 2015-03-08 VITALS — BP 156/90 | HR 70 | Temp 98.8°F | Ht 74.0 in | Wt 260.0 lb

## 2015-03-08 DIAGNOSIS — I10 Essential (primary) hypertension: Secondary | ICD-10-CM | POA: Diagnosis not present

## 2015-03-08 DIAGNOSIS — R0789 Other chest pain: Secondary | ICD-10-CM | POA: Diagnosis not present

## 2015-03-08 LAB — CBC WITH DIFFERENTIAL/PLATELET
BASOS ABS: 0 10*3/uL (ref 0.0–0.1)
Basophils Relative: 0.7 % (ref 0.0–3.0)
EOS ABS: 0.2 10*3/uL (ref 0.0–0.7)
Eosinophils Relative: 3.7 % (ref 0.0–5.0)
HEMATOCRIT: 46.1 % (ref 39.0–52.0)
HEMOGLOBIN: 15.4 g/dL (ref 13.0–17.0)
LYMPHS PCT: 42.5 % (ref 12.0–46.0)
Lymphs Abs: 2.3 10*3/uL (ref 0.7–4.0)
MCHC: 33.5 g/dL (ref 30.0–36.0)
MCV: 96.2 fl (ref 78.0–100.0)
Monocytes Absolute: 0.5 10*3/uL (ref 0.1–1.0)
Monocytes Relative: 10 % (ref 3.0–12.0)
Neutro Abs: 2.4 10*3/uL (ref 1.4–7.7)
Neutrophils Relative %: 43.1 % (ref 43.0–77.0)
PLATELETS: 295 10*3/uL (ref 150.0–400.0)
RBC: 4.79 Mil/uL (ref 4.22–5.81)
RDW: 12.7 % (ref 11.5–15.5)
WBC: 5.5 10*3/uL (ref 4.0–10.5)

## 2015-03-08 LAB — BASIC METABOLIC PANEL
BUN: 11 mg/dL (ref 6–23)
CO2: 28 mEq/L (ref 19–32)
Calcium: 9.5 mg/dL (ref 8.4–10.5)
Chloride: 101 mEq/L (ref 96–112)
Creatinine, Ser: 1.13 mg/dL (ref 0.40–1.50)
GFR: 91.5 mL/min (ref >60.00)
GLUCOSE: 90 mg/dL (ref 70–99)
POTASSIUM: 3.9 meq/L (ref 3.5–5.1)
Sodium: 136 mEq/L (ref 135–145)

## 2015-03-08 LAB — TSH: TSH: 1.35 u[IU]/mL (ref 0.35–4.50)

## 2015-03-08 NOTE — Progress Notes (Signed)
Pre visit review using our clinic review tool, if applicable. No additional management support is needed unless otherwise documented below in the visit note. 

## 2015-03-08 NOTE — Progress Notes (Signed)
   Subjective:    Patient ID: Jeffrey Todd, male    DOB: 02-22-1971, 44 y.o.   MRN: [id number]  HPI Here for one week of intermittent sharp pains in the left chest area. These are mild and do not make him sit down. There is no associated SOB or nausea or sweats. These last 1-2 minutes each and go away. They are not associated with exercise. They do not awaken him from sleep. He denies any heartburn or indigestion lately. He takes 81 mg of aspirin daily. As we speak he feels fine. He did have an ECHO treadmill evaluation about 10 years ago that was normal. His anxiety is well controlled on Lexapro.    Review of Systems  Constitutional: Negative.   Respiratory: Negative.   Cardiovascular: Positive for chest pain. Negative for palpitations and leg swelling.  Gastrointestinal: Negative.   Neurological: Negative.   Psychiatric/Behavioral: Negative.        Objective:   Physical Exam  Constitutional: He is oriented to person, place, and time. He appears well-developed and well-nourished. No distress.  Neck: Neck supple. No thyromegaly present.  Cardiovascular: Normal rate, regular rhythm, normal heart sounds and intact distal pulses.   No murmur heard. EKG is normal   Pulmonary/Chest: Effort normal and breath sounds normal. No respiratory distress. He has no wheezes. He has no rales. He exhibits no tenderness.  Abdominal: Soft. Bowel sounds are normal. He exhibits no distension and no mass. There is no tenderness. There is no rebound and no guarding.  Lymphadenopathy:    He has no cervical adenopathy.  Neurological: He is alert and oriented to person, place, and time.          Assessment & Plan:  It chest pains may come from spikes of BP so we will start treating him for HTN. Take Lisinopril 10 mg daily. Set up a Myoview treadmill evaluation for next week.

## 2015-03-11 ENCOUNTER — Telehealth: Payer: Self-pay | Admitting: Family Medicine

## 2015-03-11 ENCOUNTER — Other Ambulatory Visit: Payer: Self-pay | Admitting: Family Medicine

## 2015-03-11 NOTE — Telephone Encounter (Signed)
Pt saw dr fry on Friday and thought 2 meds were to be called in. Lisinopril 10 mg  escitalopram (LEXAPRO) 10 MG tablet  But not at pharmacy. pls advise   [facility]

## 2015-03-12 MED ORDER — ESCITALOPRAM OXALATE 10 MG PO TABS: 10.0000 mg | ORAL_TABLET | Freq: Every day | ORAL | Status: DC

## 2015-03-12 MED ORDER — LISINOPRIL 10 MG PO TABS: 10.0000 mg | ORAL_TABLET | Freq: Every day | ORAL | Status: DC

## 2015-03-12 NOTE — Telephone Encounter (Signed)
Both were sent in  

## 2015-03-12 NOTE — Telephone Encounter (Signed)
I spoke with pt  

## 2015-03-13 ENCOUNTER — Telehealth (HOSPITAL_COMMUNITY): Payer: Self-pay

## 2015-03-13 NOTE — Telephone Encounter (Signed)
Encounter complete. 

## 2015-03-15 ENCOUNTER — Ambulatory Visit (HOSPITAL_COMMUNITY)
Admission: RE | Admit: 2015-03-15 | Discharge: 2015-03-15 | Disposition: A | Discharge status: Home / Self Care | Payer: 59 | Source: Ambulatory Visit | Attending: Cardiology | Admitting: Cardiology

## 2015-03-15 DIAGNOSIS — I10 Essential (primary) hypertension: Secondary | ICD-10-CM | POA: Insufficient documentation

## 2015-03-15 DIAGNOSIS — R0789 Other chest pain: Secondary | ICD-10-CM | POA: Diagnosis not present

## 2015-03-15 LAB — MYOCARDIAL PERFUSION IMAGING
CHL CUP NUCLEAR SDS: 0
CHL CUP RESTING HR STRESS: 70 {beats}/min
CHL RATE OF PERCEIVED EXERTION: 16
CSEPEDS: 36 s
CSEPEW: 11 METS
Exercise duration (min): 9 min
LV dias vol: 118 mL
LV sys vol: 55 mL
MPHR: 178 {beats}/min
Peak HR: 164 {beats}/min
Percent HR: 92 %
SRS: 0
SSS: 0
TID: 1.09

## 2015-03-15 MED ORDER — TECHNETIUM TC 99M SESTAMIBI GENERIC - CARDIOLITE
10.3000 | Freq: Once | INTRAVENOUS | Status: AC | PRN
  Administered 2015-03-15: 10.3 via INTRAVENOUS

## 2015-03-15 MED ORDER — TECHNETIUM TC 99M SESTAMIBI GENERIC - CARDIOLITE
30.5000 | Freq: Once | INTRAVENOUS | Status: AC | PRN
  Administered 2015-03-15: 30.5 via INTRAVENOUS

## 2016-03-02 ENCOUNTER — Encounter: Payer: Self-pay | Admitting: Family Medicine

## 2016-03-02 ENCOUNTER — Ambulatory Visit (INDEPENDENT_AMBULATORY_CARE_PROVIDER_SITE_OTHER): Payer: 59 | Admitting: Family Medicine

## 2016-03-02 VITALS — BP 118/76 | Temp 98.4°F | Ht 74.0 in | Wt 272.0 lb

## 2016-03-02 DIAGNOSIS — M545 Low back pain, unspecified: Secondary | ICD-10-CM | POA: Insufficient documentation

## 2016-03-02 DIAGNOSIS — G8929 Other chronic pain: Secondary | ICD-10-CM | POA: Diagnosis not present

## 2016-03-02 DIAGNOSIS — I10 Essential (primary) hypertension: Secondary | ICD-10-CM

## 2016-03-02 MED ORDER — LISINOPRIL 10 MG PO TABS: 10.0000 mg | ORAL_TABLET | Freq: Every day | ORAL | 11 refills | Status: DC

## 2016-03-02 MED ORDER — OXYCODONE-ACETAMINOPHEN 5-325 MG PO TABS: 1.0000 | ORAL_TABLET | Freq: Three times a day (TID) | ORAL | 0 refills | Status: DC | PRN

## 2016-03-02 MED ORDER — ESCITALOPRAM OXALATE 10 MG PO TABS: 10.0000 mg | ORAL_TABLET | Freq: Every day | ORAL | 11 refills | Status: DC

## 2016-03-02 MED ORDER — DICLOFENAC SODIUM 75 MG PO TBEC: 75.0000 mg | DELAYED_RELEASE_TABLET | Freq: Two times a day (BID) | ORAL | 11 refills | Status: DC | PRN

## 2016-03-02 NOTE — Progress Notes (Signed)
Pre visit review using our clinic review tool, if applicable. No additional management support is needed unless otherwise documented below in the visit note. 

## 2016-03-02 NOTE — Progress Notes (Signed)
   Subjective:    Patient ID: Jeffrey Todd, male    DOB: [birth year], 45 y.o.   MRN: [id number]  HPI Here for refills on pain meds for his back. He has a herniated lumbar disc and he has taken Diclofenac for a few years. He also asks for a supply of something stronger to use when he has a flare up. His BP is stable.    Review of Systems  Constitutional: Negative.   Respiratory: Negative.   Cardiovascular: Negative.   Musculoskeletal: Positive for back pain.       Objective:   Physical Exam  Constitutional: He is oriented to person, place, and time. He appears well-developed and well-nourished.  Cardiovascular: Normal rate, regular rhythm, normal heart sounds and intact distal pulses.   Pulmonary/Chest: Effort normal and breath sounds normal.  Musculoskeletal: He exhibits no edema.  Neurological: He is alert and oriented to person, place, and time.          Assessment & Plan:  His HTN is stable. For the back pain we refilled Diclofenac and he can use Percocet for severe pains. He will return soon for a wellness exam.  Nelwyn SalisburyFRY,Gilmar Bua A, MD

## 2016-03-04 IMAGING — NM NM MISC PROCEDURE
6 series · 36 of 36 positions shown · non-contrast
Comparison: none

[Series 1: wbr rest · 6.40mm/px · 6 of 64 frames shown]
[frame 6/64]
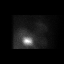
[frame 16/64]
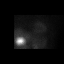
[frame 27/64]
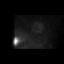
[frame 38/64]
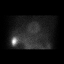
[frame 48/64]
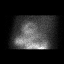
[frame 59/64]
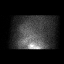

[Series 1: wbr_r-proj_st wbr rest · 6.40mm/px · 6 of 64 frames shown]
[frame 6/64]
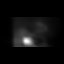
[frame 16/64]
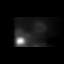
[frame 27/64]
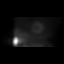
[frame 38/64]
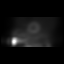
[frame 48/64]
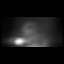
[frame 59/64]
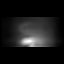

[Series 2: wbr_s-proj_st wbr stress-gsp · 6.40mm/px · 6 of 512 frames shown]
[frame 43/512]
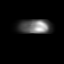
[frame 128/512]
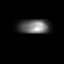
[frame 214/512]
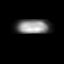
[frame 299/512]
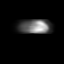
[frame 384/512]
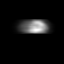
[frame 470/512]
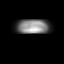

[Series 2: wbr stress-gsp · 6.40mm/px · 6 of 512 frames shown]
[frame 43/512]
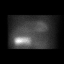
[frame 128/512]
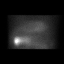
[frame 214/512]
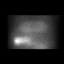
[frame 299/512]
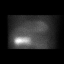
[frame 384/512]
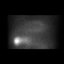
[frame 470/512]
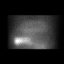

[Series 3: wbr_s-proj_st wbr stress-sum-em · 6.40mm/px · 6 of 64 frames shown]
[frame 6/64]
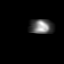
[frame 16/64]
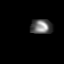
[frame 27/64]
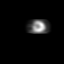
[frame 38/64]
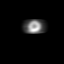
[frame 48/64]
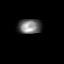
[frame 59/64]
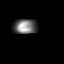

[Series 3: wbr stress-sum-em · 6.40mm/px · 6 of 64 frames shown]
[frame 6/64]
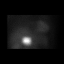
[frame 16/64]
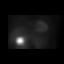
[frame 27/64]
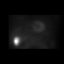
[frame 38/64]
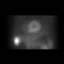
[frame 48/64]
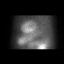
[frame 59/64]
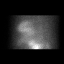

[36 of 36 positions shown; findings below may reference images not displayed]

Canned report from images found in remote index.

Refer to host system for actual result text.

## 2016-04-03 ENCOUNTER — Other Ambulatory Visit: Payer: Self-pay | Admitting: Family Medicine

## 2016-05-18 ENCOUNTER — Encounter: Payer: 59 | Admitting: Family Medicine

## 2017-03-01 ENCOUNTER — Encounter: Payer: 59 | Admitting: Family Medicine

## 2017-03-04 ENCOUNTER — Other Ambulatory Visit: Payer: Self-pay | Admitting: Pharmacist Clinician (PhC)/ Clinical Pharmacy Specialist

## 2017-03-08 ENCOUNTER — Encounter: Payer: 59 | Admitting: Family Medicine

## 2017-03-15 ENCOUNTER — Encounter: Payer: Self-pay | Admitting: Family Medicine

## 2017-03-15 ENCOUNTER — Ambulatory Visit (INDEPENDENT_AMBULATORY_CARE_PROVIDER_SITE_OTHER): Payer: 59 | Admitting: Family Medicine

## 2017-03-15 VITALS — BP 126/80 | Temp 98.1°F | Ht 74.0 in | Wt 267.0 lb

## 2017-03-15 DIAGNOSIS — R739 Hyperglycemia, unspecified: Secondary | ICD-10-CM | POA: Diagnosis not present

## 2017-03-15 DIAGNOSIS — Z0000 Encounter for general adult medical examination without abnormal findings: Secondary | ICD-10-CM

## 2017-03-15 LAB — POC URINALSYSI DIPSTICK (AUTOMATED)
Bilirubin, UA: NEGATIVE
Blood, UA: NEGATIVE
GLUCOSE UA: NEGATIVE
Ketones, UA: NEGATIVE
Leukocytes, UA: NEGATIVE
NITRITE UA: NEGATIVE
Protein, UA: NEGATIVE
SPEC GRAV UA: 1.025 (ref 1.010–1.025)
Urobilinogen, UA: 0.2 E.U./dL
pH, UA: 6 (ref 5.0–8.0)

## 2017-03-15 MED ORDER — ESCITALOPRAM OXALATE 10 MG PO TABS: 10.0000 mg | ORAL_TABLET | Freq: Every day | ORAL | 3 refills | Status: DC

## 2017-03-15 MED ORDER — METRONIDAZOLE 500 MG PO TABS: 500.0000 mg | ORAL_TABLET | Freq: Three times a day (TID) | ORAL | 0 refills | Status: DC

## 2017-03-15 MED ORDER — OXYCODONE-ACETAMINOPHEN 5-325 MG PO TABS: 1.0000 | ORAL_TABLET | Freq: Three times a day (TID) | ORAL | 0 refills | Status: DC | PRN

## 2017-03-15 MED ORDER — LISINOPRIL 10 MG PO TABS: 10.0000 mg | ORAL_TABLET | Freq: Every day | ORAL | 3 refills | Status: DC

## 2017-03-15 MED ORDER — DICLOFENAC SODIUM 75 MG PO TBEC: 75.0000 mg | DELAYED_RELEASE_TABLET | Freq: Two times a day (BID) | ORAL | 3 refills | Status: DC | PRN

## 2017-03-15 NOTE — Progress Notes (Signed)
   Subjective:    Patient ID: Jeffrey Todd, male    DOB: 1971/01/04, 46 y.o.   MRN: [id number]  HPI Here for a well exam. He has a few issues to discuss. His back pain has flared up again and he asks for a refill of Percocet. Also he had a wellness screening recently on his job site where his A1c returned elevated to 7.0. He feels well in general. He has no family hx of diabetes. Lastly we treated him for exposure to Trichomonas earlier this year, and he has avoided any symptoms. However his fiance has been diagnosed with this again and he would like to be treated. Again he has no symptoms.    Review of Systems  Constitutional: Negative.   HENT: Negative.   Eyes: Negative.   Respiratory: Negative.   Cardiovascular: Negative.   Gastrointestinal: Negative.   Genitourinary: Negative.   Musculoskeletal: Positive for back pain.  Skin: Negative.   Neurological: Negative.   Psychiatric/Behavioral: Negative.        Objective:   Physical Exam  Constitutional: He is oriented to person, place, and time. He appears well-developed and well-nourished. No distress.  HENT:  Head: Normocephalic and atraumatic.  Right Ear: External ear normal.  Left Ear: External ear normal.  Nose: Nose normal.  Mouth/Throat: Oropharynx is clear and moist. No oropharyngeal exudate.  Eyes: Conjunctivae and EOM are normal. Pupils are equal, round, and reactive to light. Right eye exhibits no discharge. Left eye exhibits no discharge. No scleral icterus.  Neck: Neck supple. No JVD present. No tracheal deviation present. No thyromegaly present.  Cardiovascular: Normal rate, regular rhythm, normal heart sounds and intact distal pulses. Exam reveals no gallop and no friction rub.  No murmur heard. Pulmonary/Chest: Effort normal and breath sounds normal. No respiratory distress. He has no wheezes. He has no rales. He exhibits no tenderness.  Abdominal: Soft. Bowel sounds are normal. He exhibits no distension and no  mass. There is no tenderness. There is no rebound and no guarding.  Genitourinary: Rectum normal, prostate normal and penis normal. Rectal exam shows guaiac negative stool. No penile tenderness.  Musculoskeletal: Normal range of motion. He exhibits no edema or tenderness.  Lymphadenopathy:    He has no cervical adenopathy.  Neurological: He is alert and oriented to person, place, and time. He has normal reflexes. No cranial nerve deficit. He exhibits normal muscle tone. Coordination normal.  Skin: Skin is warm and dry. No rash noted. He is not diaphoretic. No erythema. No pallor.  Psychiatric: He has a normal mood and affect. His behavior is normal. Judgment and thought content normal.          Assessment & Plan:  Well exam. We discussed diet and exercise. Get fasting labs including another A1c. Refilled Percocet to use prn. Given another 7 days of Flagyl.  Gershon CraneStephen Pj Zehner, MD

## 2017-03-15 NOTE — Patient Instructions (Signed)
WE NOW OFFER   [facility] FAST TRACK!!!  SAME DAY Appointments for ACUTE CARE  Such as: Sprains, Injuries, cuts, abrasions, rashes, muscle pain, joint pain, back pain Colds, flu, sore throats, headache, allergies, cough, fever  Ear pain, sinus and eye infections Abdominal pain, nausea, vomiting, diarrhea, upset stomach Animal/insect bites  3 Easy Ways to Schedule: Walk-In Scheduling Call in scheduling Mychart Sign-up: [email or web address]         

## 2017-03-16 LAB — HEMOGLOBIN A1C: HEMOGLOBIN A1C: 6.6 % — AB (ref 4.6–6.5)

## 2017-03-16 LAB — CBC WITH DIFFERENTIAL/PLATELET
BASOS PCT: 1 % (ref 0.0–3.0)
Basophils Absolute: 0.1 10*3/uL (ref 0.0–0.1)
Eosinophils Absolute: 0.2 10*3/uL (ref 0.0–0.7)
Eosinophils Relative: 3.3 % (ref 0.0–5.0)
HCT: 44.6 % (ref 39.0–52.0)
HEMOGLOBIN: 14.9 g/dL (ref 13.0–17.0)
LYMPHS PCT: 44 % (ref 12.0–46.0)
Lymphs Abs: 3 10*3/uL (ref 0.7–4.0)
MCHC: 33.3 g/dL (ref 30.0–36.0)
MCV: 94.8 fl (ref 78.0–100.0)
MONOS PCT: 11.1 % (ref 3.0–12.0)
Monocytes Absolute: 0.8 10*3/uL (ref 0.1–1.0)
NEUTROS ABS: 2.8 10*3/uL (ref 1.4–7.7)
Neutrophils Relative %: 40.6 % — ABNORMAL LOW (ref 43.0–77.0)
PLATELETS: 300 10*3/uL (ref 150.0–400.0)
RBC: 4.71 Mil/uL (ref 4.22–5.81)
RDW: 13.2 % (ref 11.5–15.5)
WBC: 6.8 10*3/uL (ref 4.0–10.5)

## 2017-03-16 LAB — HEPATIC FUNCTION PANEL
ALK PHOS: 67 U/L (ref 39–117)
ALT: 29 U/L (ref 0–53)
AST: 20 U/L (ref 0–37)
Albumin: 4.3 g/dL (ref 3.5–5.2)
BILIRUBIN TOTAL: 0.4 mg/dL (ref 0.2–1.2)
Bilirubin, Direct: 0.1 mg/dL (ref 0.0–0.3)
Total Protein: 7.8 g/dL (ref 6.0–8.3)

## 2017-03-16 LAB — LIPID PANEL
Cholesterol: 207 mg/dL — ABNORMAL HIGH (ref 0–200)
HDL: 40.9 mg/dL (ref >39.00)
LDL CALC: 141 mg/dL — AB (ref 0–99)
NONHDL: 166.23
Total CHOL/HDL Ratio: 5
Triglycerides: 125 mg/dL (ref 0.0–149.0)
VLDL: 25 mg/dL (ref 0.0–40.0)

## 2017-03-16 LAB — BASIC METABOLIC PANEL
BUN: 13 mg/dL (ref 6–23)
CALCIUM: 9.5 mg/dL (ref 8.4–10.5)
CO2: 28 mEq/L (ref 19–32)
Chloride: 102 mEq/L (ref 96–112)
Creatinine, Ser: 1.13 mg/dL (ref 0.40–1.50)
GFR: 90.63 mL/min (ref >60.00)
Glucose, Bld: 106 mg/dL — ABNORMAL HIGH (ref 70–99)
POTASSIUM: 3.9 meq/L (ref 3.5–5.1)
Sodium: 136 mEq/L (ref 135–145)

## 2017-03-16 LAB — TSH: TSH: 2.26 u[IU]/mL (ref 0.35–4.50)

## 2017-03-16 LAB — PSA: PSA: 0.69 ng/mL (ref 0.10–4.00)

## 2017-11-04 ENCOUNTER — Other Ambulatory Visit: Payer: Self-pay | Admitting: Family Medicine

## 2017-11-22 ENCOUNTER — Ambulatory Visit: Payer: Self-pay | Admitting: *Deleted

## 2017-11-22 NOTE — Telephone Encounter (Signed)
Pt states intermittent chest pain x 2 weeks "With movement only," now constant. States left sided at breast area, at times radiates to back. Does not worsen with inspiration, no congestion. Denies any SOB, diaphoresis, dizziness, nausea. Does not recall any injury. H/O HTN; has not had BP checked recently "Not for quite a while." While NT attempting to secure appt.pt states he will go to UC. Care advise given.   Reason for Disposition . [1] Chest pain lasts > 5 minutes AND [2] occurred > 3 days ago (72 hours) AND [3] NO chest pain or cardiac symptoms now    Present "Discomfort" was intermittent x 2 weeks, now constant. No other symptoms.  Answer Assessment - Initial Assessment Questions 1. LOCATION: "Where does it hurt?"       Left side at breast area 2. RADIATION: "Does the pain go anywhere else?" (e.g., into neck, jaw, arms, back)     Back at times 3. ONSET: "When did the chest pain begin?" (Minutes, hours or days)      2 weeks ago with movement, now constant 4. PATTERN "Does the pain come and go, or has it been constant since it started?"  "Does it get worse with exertion?"       5. DURATION: "How long does it last" (e.g., seconds, minutes, hours)     Constant now; intermittent x 2 weeks with movement. 6. SEVERITY: "How bad is the pain?"  (e.g., Scale 1-10; mild, moderate, or severe)    - MILD (1-3): doesn't interfere with normal activities     - MODERATE (4-7): interferes with normal activities or awakens from sleep    - SEVERE (8-10): excruciating pain, unable to do any normal activities       5/10 7. CARDIAC RISK FACTORS: "Do you have any history of heart problems or risk factors for heart disease?" (e.g., prior heart attack, angina; high blood pressure, diabetes, being overweight, high cholesterol, smoking, or strong family history of heart disease)     HTN 8. PULMONARY RISK FACTORS: "Do you have any history of lung disease?"  (e.g., blood clots in lung, asthma, emphysema, birth  control pills)     no 9. CAUSE: "What do you think is causing the chest pain?"     unsure 10. OTHER SYMPTOMS: "Do you have any other symptoms?" (e.g., dizziness, nausea, vomiting, sweating, fever, difficulty breathing, cough)       no  Protocols used: CHEST PAIN-A-AH

## 2017-11-22 NOTE — Telephone Encounter (Signed)
Monitor for either Urgent care or ER arrival.

## 2017-11-23 NOTE — Telephone Encounter (Signed)
I left a voice message for pt to return my call.  

## 2017-11-23 NOTE — Progress Notes (Signed)
Chief Complaint  Patient presents with  . Chest Pain    Pt states that he has been having chest x 2-3 weeks. Pain radiates to back of neck and into back. Pt also having headaches. Does not recall injury. Denis Heart palpitations.     HPI:   Jeffrey Todd 47 y.o. come in for SDA PCP na  Having intermittent chest pain   See triage notes . Sharp pain comes nad goes  Like a burning  Pressure and middle of back in neck   Other times  Last   For 2-3 mintues to 20 moiutes . Without assoc sx nausea sweats  vomiting sob other or radiation  . No exercise intolerance but not that active at present. 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    Hx of pain beofer  And had myoview low risk study     Was worse  Then  ROS: See pertinent positives and negatives per HPI. Some stress  Lot of responsibilities at home   Past Medical History:  Diagnosis Date  . Hemorrhoids     No family history on file.  Social History   Socioeconomic History  . Marital status: Divorced    Spouse name: Not on file  . Number of children: Not on file  . Years of education: Not on file  . Highest education level: Not on file  Occupational History  . Not on file  Social Needs  . Financial resource strain: Not on file  . Food insecurity:    Worry: Not on file    Inability: Not on file  . Transportation needs:    Medical: Not on file    Non-medical: Not on file  Tobacco Use  . Smoking status: Former Smoker    Last attempt to quit: 05/04/2004    Years since quitting: 13.5  . Smokeless tobacco: Never Used  Substance and Sexual Activity  . Alcohol use: Yes    Alcohol/week: 0.0 oz    Comment: occ  . Drug use: No  . Sexual activity: Not on file  Lifestyle  . Physical activity:    Days per week: Not on file    Minutes per session: Not on file  . Stress: Not on file  Relationships  . Social connections:    Talks on phone: Not on file    Gets together: Not on file    Attends religious service: Not on file     Active member of club or organization: Not on file    Attends meetings of clubs or organizations: Not on file    Relationship status: Not on file  Other Topics Concern  . Not on file  Social History Narrative  . Not on file    Outpatient Medications Prior to Visit  Medication Sig Dispense Refill  . aspirin 81 MG tablet Take 81 mg by mouth daily.    Marland Kitchen. escitalopram (LEXAPRO) 10 MG tablet TAKE 1 TABLET (10 MG TOTAL) DAILY BY MOUTH. 90 tablet 1  . lisinopril (PRINIVIL,ZESTRIL) 10 MG tablet Take 1 tablet (10 mg total) daily by mouth. 90 tablet 3  . diclofenac (VOLTAREN) 75 MG EC tablet Take 1 tablet (75 mg total) 2 (two) times daily as needed by mouth for mild pain. (Patient not taking: Reported on 11/24/2017) 180 tablet 3  . metroNIDAZOLE (FLAGYL) 500 MG tablet Take 1 tablet (500 mg total) 3 (three) times daily by mouth. (Patient not taking: Reported on 11/24/2017) 21 tablet 0  .  oxyCODONE-acetaminophen (ROXICET) 5-325 MG tablet Take 1 tablet every 8 (eight) hours as needed by mouth for severe pain. (Patient not taking: Reported on 11/24/2017) 60 tablet 0   No facility-administered medications prior to visit.      EXAM:  BP 122/76 (BP Location: Right Arm, Patient Position: Sitting, Cuff Size: Large)   Pulse 73   Temp 98.6 F (37 C) (Oral)   Wt 273 lb (123.8 kg)   BMI 35.05 kg/m   Body mass index is 35.05 kg/m.  GENERAL: vitals reviewed and listed above, alert, oriented, appears well hydrated and in no acute distress here with wife  HEENT: atraumatic, conjunctiva  clear, no obvious abnormalities on inspection of external nose and ears  NECK: no obvious masses on inspection palpation  LUNGS: clear to auscultation bilaterally, no wheezes, rales or rhonchi, good air movement CV: HRRR, no clubbing cyanosis or  peripheral edema nl cap refill  ? If tender sore at cc junciont left t 4?  No crepitus  Abdomen:  Sof,t normal bowel sounds without hepatosplenomegaly, no guarding rebound or  masses no CVA tenderness MS: moves all extremities without noticeable focal  abnormality PSYCH: pleasant and cooperative, no obvious depression or anxiety.    BP Readings from Last 3 Encounters:  11/24/17 122/76  03/15/17 126/80  03/02/16 118/76    ASSESSMENT AND PLAN:  Discussed the following assessment and plan:  Chest pain, unspecified type - Plan: EKG 12-Lead, DG Chest 2 View  Atypical chest pain - Plan: DG Chest 2 View ekg ns t wave but not that much  different than previous.  Record reviewed .     Expectant management. And seek care if emergent sx .  Otherwise  rov with  dr Clent Ridges in about 2 weeks or as needed   ? If could be cwp  But does have risk  -Patient advised to return or notify health care team  if  new concerns arise.  Patient Instructions  Exam is good . Today  Type of pain is not typical of heart pain  But plan follow up .   Chest x ray is normal . ekg show no acute changes  Some nonspecific   Findings   Plan fu with dr Clent Ridges but if get  Associated sx and worsening   Short of breath  Seek emergent care .   Neta Mends. Wilda Wetherell M.D.

## 2017-11-23 NOTE — Telephone Encounter (Signed)
Patient returned call. He is not currently experiencing pain and is requesting OV tomorrow after 3pm. Discussed w/ Dr. Dava NajjarPanosh-okay to schedule. Patient scheduled for 3:45 tomorrow and notified to proceed to ED prior to appt if chest pain w/ associated SOB, diaphoresis, nausea, etc and verbalized understanding.

## 2017-11-24 ENCOUNTER — Ambulatory Visit (INDEPENDENT_AMBULATORY_CARE_PROVIDER_SITE_OTHER): Payer: 59 | Admitting: Internal Medicine

## 2017-11-24 ENCOUNTER — Ambulatory Visit (INDEPENDENT_AMBULATORY_CARE_PROVIDER_SITE_OTHER): Payer: 59

## 2017-11-24 ENCOUNTER — Encounter: Payer: Self-pay | Admitting: Internal Medicine

## 2017-11-24 VITALS — BP 122/76 | HR 73 | Temp 98.6°F | Wt 273.0 lb

## 2017-11-24 DIAGNOSIS — R079 Chest pain, unspecified: Secondary | ICD-10-CM

## 2017-11-24 DIAGNOSIS — R0789 Other chest pain: Secondary | ICD-10-CM

## 2017-11-24 NOTE — Patient Instructions (Addendum)
Exam is good . Today  Type of pain is not typical of heart pain  But plan follow up .   Chest x ray is normal . ekg show no acute changes  Some nonspecific   Findings   Plan fu with dr Clent RidgesFry but if get  Associated sx and worsening   Short of breath  Seek emergent care .

## 2017-12-27 ENCOUNTER — Encounter: Payer: Self-pay | Admitting: Family Medicine

## 2017-12-27 ENCOUNTER — Ambulatory Visit (INDEPENDENT_AMBULATORY_CARE_PROVIDER_SITE_OTHER): Payer: 59 | Admitting: Family Medicine

## 2017-12-27 VITALS — BP 136/80 | HR 76 | Temp 97.7°F | Ht 74.0 in | Wt 272.2 lb

## 2017-12-27 DIAGNOSIS — R739 Hyperglycemia, unspecified: Secondary | ICD-10-CM | POA: Diagnosis not present

## 2017-12-27 DIAGNOSIS — I10 Essential (primary) hypertension: Secondary | ICD-10-CM | POA: Diagnosis not present

## 2017-12-27 DIAGNOSIS — E781 Pure hyperglyceridemia: Secondary | ICD-10-CM | POA: Diagnosis not present

## 2017-12-27 DIAGNOSIS — E1169 Type 2 diabetes mellitus with other specified complication: Secondary | ICD-10-CM

## 2017-12-27 MED ORDER — OXYCODONE-ACETAMINOPHEN 5-325 MG PO TABS: 1.0000 | ORAL_TABLET | ORAL | 0 refills | Status: DC | PRN

## 2017-12-27 NOTE — Progress Notes (Signed)
   Subjective:    Patient ID: Jeffrey Todd, male    DOB: Aug 31, 1970, [age] y.o.   MRN: [id number]  HPI Here to follow up on some results from a work site wellness screening on 12-23-17. That day his BP on 2 different readings was 140/100. On labs (he was truly fasting) his glucose was 141 and TG were 330. His total cholesterol was fine at 168. Of note his fasting glucose was borderline at 106 at is physical here last Novemeber, and the A1c was 6.6. His BP then was fine. His BP here last month and again today have been reasonable. He feels great.    Review of Systems  Constitutional: Negative.   Respiratory: Negative.   Cardiovascular: Negative.   Neurological: Negative.        Objective:   Physical Exam  Constitutional: He is oriented to person, place, and time. He appears well-developed and well-nourished.  Neck: No thyromegaly present.  Cardiovascular: Normal rate, regular rhythm, normal heart sounds and intact distal pulses.  Pulmonary/Chest: Effort normal and breath sounds normal.  Musculoskeletal: He exhibits no edema.  Lymphadenopathy:    He has no cervical adenopathy.  Neurological: He is alert and oriented to person, place, and time.          Assessment & Plan:  His HTN seems to be well controlled on Lisinopril. However his glucose has worsened. We will check an A1c today. 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 We discussed his diet today, that he needs to reduce his carb intake and that he needs to lose weight. I will likely refer him to Nutrition as well but we will wait on the A1c for now.  Gershon CraneStephen Fry, MD

## 2017-12-28 DIAGNOSIS — E1169 Type 2 diabetes mellitus with other specified complication: Secondary | ICD-10-CM | POA: Insufficient documentation

## 2017-12-28 LAB — HEMOGLOBIN A1C: Hgb A1c MFr Bld: 7.7 % — ABNORMAL HIGH (ref 4.6–6.5)

## 2017-12-28 NOTE — Addendum Note (Signed)
Addended by: Gershon CraneFRY, Tyde Lamison A on: 12/28/2017 02:44 PM   Modules accepted: Orders

## 2018-01-27 ENCOUNTER — Ambulatory Visit: Payer: 59 | Admitting: Dietician

## 2018-02-17 ENCOUNTER — Ambulatory Visit: Payer: 59 | Admitting: Dietician

## 2018-05-07 ENCOUNTER — Other Ambulatory Visit: Payer: Self-pay | Admitting: Family Medicine

## 2018-06-08 ENCOUNTER — Other Ambulatory Visit: Payer: Self-pay | Admitting: Family Medicine

## 2018-12-17 ENCOUNTER — Other Ambulatory Visit: Payer: Self-pay | Admitting: Family Medicine

## 2019-01-03 ENCOUNTER — Ambulatory Visit (INDEPENDENT_AMBULATORY_CARE_PROVIDER_SITE_OTHER): Payer: 59 | Admitting: Family Medicine

## 2019-01-03 ENCOUNTER — Encounter: Payer: Self-pay | Admitting: Family Medicine

## 2019-01-03 ENCOUNTER — Other Ambulatory Visit: Payer: Self-pay

## 2019-01-03 VITALS — BP 120/70 | HR 80 | Temp 97.9°F | Wt 275.6 lb

## 2019-01-03 DIAGNOSIS — Z23 Encounter for immunization: Secondary | ICD-10-CM | POA: Diagnosis not present

## 2019-01-03 DIAGNOSIS — Z0000 Encounter for general adult medical examination without abnormal findings: Secondary | ICD-10-CM | POA: Diagnosis not present

## 2019-01-03 MED ORDER — DICLOFENAC SODIUM 75 MG PO TBEC: 75.0000 mg | DELAYED_RELEASE_TABLET | Freq: Two times a day (BID) | ORAL | 2 refills | Status: DC

## 2019-01-04 ENCOUNTER — Encounter: Payer: Self-pay | Admitting: Family Medicine

## 2019-01-04 NOTE — Progress Notes (Signed)
Subjective:    Patient ID: Jeffrey Todd, male    DOB: [birth year], 48 y.o.   MRN: [id number]  HPI Here for a well exam. He has felt fine except for some sharp pain in the right elbow that started about one month ago. No hx of trauma. He has tried Diclofenac gel but this has not helped. His BP has been stable. He asks about colon cancer screening because his father has been treated for colon cancer.    Review of Systems  Constitutional: Negative.   HENT: Negative.   Eyes: Negative.   Respiratory: Negative.   Cardiovascular: Negative.   Gastrointestinal: Negative.   Genitourinary: Negative.   Musculoskeletal: Positive for arthralgias.  Skin: Negative.   Neurological: Negative.   Psychiatric/Behavioral: Negative.        Objective:   Physical Exam Constitutional:      General: He is not in acute distress.    Appearance: He is well-developed. He is not diaphoretic.  HENT:     Head: Normocephalic and atraumatic.     Right Ear: External ear normal.     Left Ear: External ear normal.     Nose: Nose normal.     Mouth/Throat:     Pharynx: No oropharyngeal exudate.  Eyes:     General: No scleral icterus.       Right eye: No discharge.        Left eye: No discharge.     Conjunctiva/sclera: Conjunctivae normal.     Pupils: Pupils are equal, round, and reactive to light.  Neck:     Musculoskeletal: Neck supple.     Thyroid: No thyromegaly.     Vascular: No JVD.     Trachea: No tracheal deviation.  Cardiovascular:     Rate and Rhythm: Normal rate and regular rhythm.     Heart sounds: Normal heart sounds. No murmur. No friction rub. No gallop.   Pulmonary:     Effort: Pulmonary effort is normal. No respiratory distress.     Breath sounds: Normal breath sounds. No wheezing or rales.  Chest:     Chest wall: No tenderness.  Abdominal:     General: Bowel sounds are normal. There is no distension.     Palpations: Abdomen is soft. There is no mass.     Tenderness: There is no  abdominal tenderness. There is no guarding or rebound.  Genitourinary:    Penis: Normal. No tenderness.      Scrotum/Testes: Normal.     Prostate: Normal.     Rectum: Normal. Guaiac result negative.  Musculoskeletal: Normal range of motion.     Comments: He is tender over the right lateral epicondyle. ROM of the elbow is full   Lymphadenopathy:     Cervical: No cervical adenopathy.  Skin:    General: Skin is warm and dry.     Coloration: Skin is not pale.     Findings: No erythema or rash.  Neurological:     Mental Status: He is alert and oriented to person, place, and time.     Cranial Nerves: No cranial nerve deficit.     Motor: No abnormal muscle tone.     Coordination: Coordination normal.     Deep Tendon Reflexes: Reflexes are normal and symmetric. Reflexes normal.  Psychiatric:        Behavior: Behavior normal.        Thought Content: Thought content normal.        Judgment: Judgment normal.  Assessment & Plan:  Well exam. We discussed diet and exercise. Get fasting labs. We will refer him for colonoscopy. He also has lateral epicondylitis and we will treat this with rest, ice, and oral Diclofenac prn.  Gershon CraneStephen Delpha Perko, MD

## 2019-01-05 ENCOUNTER — Other Ambulatory Visit: Payer: Self-pay | Admitting: Family Medicine

## 2019-01-05 ENCOUNTER — Other Ambulatory Visit (INDEPENDENT_AMBULATORY_CARE_PROVIDER_SITE_OTHER): Payer: 59

## 2019-01-05 ENCOUNTER — Other Ambulatory Visit: Payer: Self-pay

## 2019-01-05 DIAGNOSIS — Z0000 Encounter for general adult medical examination without abnormal findings: Secondary | ICD-10-CM

## 2019-01-05 LAB — POC URINALSYSI DIPSTICK (AUTOMATED)
Bilirubin, UA: NEGATIVE
Blood, UA: NEGATIVE
Glucose, UA: NEGATIVE
Ketones, UA: NEGATIVE
Leukocytes, UA: NEGATIVE
Nitrite, UA: NEGATIVE
Protein, UA: NEGATIVE
Spec Grav, UA: 1.025 (ref 1.010–1.025)
Urobilinogen, UA: 0.2 E.U./dL
pH, UA: 6 (ref 5.0–8.0)

## 2019-01-05 LAB — HEPATIC FUNCTION PANEL
ALT: 25 U/L (ref 0–53)
AST: 17 U/L (ref 0–37)
Albumin: 3.9 g/dL (ref 3.5–5.2)
Alkaline Phosphatase: 59 U/L (ref 39–117)
Bilirubin, Direct: 0.1 mg/dL (ref 0.0–0.3)
Total Bilirubin: 0.4 mg/dL (ref 0.2–1.2)
Total Protein: 7.3 g/dL (ref 6.0–8.3)

## 2019-01-05 LAB — CBC WITH DIFFERENTIAL/PLATELET
Basophils Absolute: 0 10*3/uL (ref 0.0–0.1)
Basophils Relative: 0.8 % (ref 0.0–3.0)
Eosinophils Absolute: 0.2 10*3/uL (ref 0.0–0.7)
Eosinophils Relative: 3.7 % (ref 0.0–5.0)
HCT: 42.3 % (ref 39.0–52.0)
Hemoglobin: 14.3 g/dL (ref 13.0–17.0)
Lymphocytes Relative: 37.6 % (ref 12.0–46.0)
Lymphs Abs: 1.9 10*3/uL (ref 0.7–4.0)
MCHC: 33.8 g/dL (ref 30.0–36.0)
MCV: 94.9 fl (ref 78.0–100.0)
Monocytes Absolute: 0.5 10*3/uL (ref 0.1–1.0)
Monocytes Relative: 10.8 % (ref 3.0–12.0)
Neutro Abs: 2.4 10*3/uL (ref 1.4–7.7)
Neutrophils Relative %: 47.1 % (ref 43.0–77.0)
Platelets: 270 10*3/uL (ref 150.0–400.0)
RBC: 4.46 Mil/uL (ref 4.22–5.81)
RDW: 12.7 % (ref 11.5–15.5)
WBC: 5 10*3/uL (ref 4.0–10.5)

## 2019-01-05 LAB — BASIC METABOLIC PANEL
BUN: 15 mg/dL (ref 6–23)
CO2: 29 mEq/L (ref 19–32)
Calcium: 9.1 mg/dL (ref 8.4–10.5)
Chloride: 101 mEq/L (ref 96–112)
Creatinine, Ser: 1.2 mg/dL (ref 0.40–1.50)
GFR: 78.91 mL/min (ref >60.00)
Glucose, Bld: 144 mg/dL — ABNORMAL HIGH (ref 70–99)
Potassium: 4.1 mEq/L (ref 3.5–5.1)
Sodium: 136 mEq/L (ref 135–145)

## 2019-01-05 LAB — TSH: TSH: 1.6 u[IU]/mL (ref 0.35–4.50)

## 2019-01-05 LAB — LIPID PANEL
Cholesterol: 186 mg/dL (ref 0–200)
HDL: 38.5 mg/dL — ABNORMAL LOW (ref >39.00)
LDL Cholesterol: 124 mg/dL — ABNORMAL HIGH (ref 0–99)
NonHDL: 147.37
Total CHOL/HDL Ratio: 5
Triglycerides: 115 mg/dL (ref 0.0–149.0)
VLDL: 23 mg/dL (ref 0.0–40.0)

## 2019-01-05 LAB — PSA: PSA: 0.86 ng/mL (ref 0.10–4.00)

## 2019-01-05 MED ORDER — METFORMIN HCL 500 MG PO TABS: 500.0000 mg | ORAL_TABLET | Freq: Two times a day (BID) | ORAL | 3 refills | Status: DC

## 2019-02-04 ENCOUNTER — Other Ambulatory Visit: Payer: Self-pay | Admitting: Family Medicine

## 2019-02-09 ENCOUNTER — Encounter: Payer: Self-pay | Admitting: Gastroenterology

## 2019-02-21 ENCOUNTER — Ambulatory Visit (INDEPENDENT_AMBULATORY_CARE_PROVIDER_SITE_OTHER): Payer: 59 | Admitting: Family Medicine

## 2019-02-21 ENCOUNTER — Other Ambulatory Visit: Payer: Self-pay

## 2019-02-21 VITALS — BP 116/76 | HR 76 | Temp 97.6°F | Resp 16 | Ht 74.0 in | Wt 269.0 lb

## 2019-02-21 DIAGNOSIS — G2581 Restless legs syndrome: Secondary | ICD-10-CM

## 2019-02-21 MED ORDER — PRAMIPEXOLE DIHYDROCHLORIDE 0.125 MG PO TABS: ORAL_TABLET | ORAL | 3 refills | Status: DC

## 2019-02-21 NOTE — Patient Instructions (Signed)

## 2019-02-21 NOTE — Progress Notes (Signed)
  Subjective:     Patient ID: Jeffrey Todd, male   DOB: 12-17-1970, 48 y.o.   MRN: [id number]  HPI  Mr. Carchi is here with concern for possible restless leg symptoms.  Started particularly about a week ago.  He did run out of Lexapro but that was almost 3 weeks ago and symptoms only started about a week ago.  He has been drinking more Coke 0 and is not sure if this is caffeine free.  No history of anemia.  He had recent labs with hemoglobin 14.3.  Symptoms are worse at night.  He has a crawling type sensation of both legs which is somewhat relieved with walking and activity.  Has been extremely disturbing and interfering with his sleep.  He has gotten only a couple hours sleep past couple nights because of this.  No regular alcohol use.  Past Medical History:  Diagnosis Date  . Hemorrhoids    No past surgical history on file.  reports that he quit smoking about 14 years ago. He has never used smokeless tobacco. He reports current alcohol use. He reports that he does not use drugs. family history is not on file. No Known Allergies  Review of Systems  Constitutional: Negative for appetite change and unexpected weight change.  Respiratory: Negative for cough and shortness of breath.   Cardiovascular: Negative for chest pain and leg swelling.  Neurological: Negative for weakness and numbness.       Objective:   Physical Exam Constitutional:      Appearance: He is well-developed.  HENT:     Right Ear: External ear normal.     Left Ear: External ear normal.  Eyes:     Pupils: Pupils are equal, round, and reactive to light.  Neck:     Musculoskeletal: Neck supple.     Thyroid: No thyromegaly.  Cardiovascular:     Rate and Rhythm: Normal rate and regular rhythm.  Pulmonary:     Effort: Pulmonary effort is normal. No respiratory distress.     Breath sounds: Normal breath sounds. No wheezing or rales.  Musculoskeletal:     Right lower leg: No edema.     Left lower leg: No edema.   Neurological:     Mental Status: He is alert and oriented to person, place, and time.     Comments: No focal strength deficits.        Assessment:     Restless leg symptoms    Plan:     -Stressed importance of avoidance of late day use of caffeine -Consider trial of Mirapex 0.125 mg 1-2 at night as needed -Recommend follow-up with primary  Eulas Post MD [facility]

## 2019-03-13 ENCOUNTER — Other Ambulatory Visit: Payer: Self-pay

## 2019-03-13 DIAGNOSIS — Z20822 Contact with and (suspected) exposure to covid-19: Secondary | ICD-10-CM

## 2019-03-14 ENCOUNTER — Ambulatory Visit: Payer: 59 | Admitting: Gastroenterology

## 2019-03-14 LAB — NOVEL CORONAVIRUS, NAA: SARS-CoV-2, NAA: NOT DETECTED

## 2019-04-22 ENCOUNTER — Other Ambulatory Visit: Payer: Self-pay | Admitting: Family Medicine

## 2019-05-14 ENCOUNTER — Other Ambulatory Visit: Payer: Self-pay | Admitting: Family Medicine

## 2019-05-15 ENCOUNTER — Other Ambulatory Visit: Payer: Self-pay | Admitting: Family Medicine

## 2019-07-10 ENCOUNTER — Ambulatory Visit (INDEPENDENT_AMBULATORY_CARE_PROVIDER_SITE_OTHER): Payer: 59 | Admitting: Family Medicine

## 2019-07-10 ENCOUNTER — Encounter: Payer: Self-pay | Admitting: Family Medicine

## 2019-07-10 ENCOUNTER — Other Ambulatory Visit: Payer: Self-pay

## 2019-07-10 VITALS — BP 140/80 | HR 88 | Temp 97.2°F | Wt 280.0 lb

## 2019-07-10 DIAGNOSIS — R109 Unspecified abdominal pain: Secondary | ICD-10-CM | POA: Diagnosis not present

## 2019-07-10 DIAGNOSIS — R6889 Other general symptoms and signs: Secondary | ICD-10-CM

## 2019-07-10 DIAGNOSIS — R519 Headache, unspecified: Secondary | ICD-10-CM

## 2019-07-10 DIAGNOSIS — M791 Myalgia, unspecified site: Secondary | ICD-10-CM | POA: Diagnosis not present

## 2019-07-10 NOTE — Progress Notes (Signed)
   Subjective:    Patient ID: Jeffrey Todd, male    DOB: 1970/09/25, 49 y.o.   MRN: [id number]  HPI Here with concerns about his BP. Normally his BP is stable, but over the past 3 days it has been as high as 160/100 at home. During this same period he has had upper abdominal cramps, body aches, and headaches. No fever or loss of taste or smell. No cough or SOB. No NVD. He has been taking Ibuprofen and drinking water. No one else in his family is sick.    Review of Systems  Constitutional: Negative.   HENT: Negative.   Eyes: Negative.   Respiratory: Negative.   Cardiovascular: Negative.   Gastrointestinal: Positive for abdominal pain. Negative for abdominal distention, anal bleeding, blood in stool, constipation, diarrhea, nausea, rectal pain and vomiting.  Musculoskeletal: Positive for myalgias.  Neurological: Positive for headaches.       Objective:   Physical Exam Constitutional:      Appearance: Normal appearance. He is not ill-appearing.  HENT:     Right Ear: Tympanic membrane, ear canal and external ear normal.     Left Ear: Tympanic membrane, ear canal and external ear normal.     Nose: Nose normal.     Mouth/Throat:     Pharynx: Oropharynx is clear.  Eyes:     Conjunctiva/sclera: Conjunctivae normal.  Cardiovascular:     Rate and Rhythm: Normal rate and regular rhythm.     Pulses: Normal pulses.     Heart sounds: Normal heart sounds.  Pulmonary:     Effort: Pulmonary effort is normal.     Breath sounds: Normal breath sounds.  Abdominal:     General: Abdomen is flat. Bowel sounds are normal. There is no distension.     Palpations: Abdomen is soft. There is no mass.     Tenderness: There is no abdominal tenderness. There is no guarding or rebound.     Hernia: No hernia is present.  Lymphadenopathy:     Cervical: No cervical adenopathy.  Neurological:     Mental Status: He is alert.           Assessment & Plan:  I think he has a viral illness of some  sort, and this has temporarily upset his BP. We will continue to watch the BP for now. For the viral illness I suggested he take Tylenol as needed rather then Ibuprofen since this is easier on the GI tract. I urged [facility] to [facility] the Covid-19 virus today, and he agreed.  Gershon Crane, MD

## 2019-07-20 ENCOUNTER — Other Ambulatory Visit: Payer: Self-pay | Admitting: Family Medicine

## 2019-07-26 ENCOUNTER — Other Ambulatory Visit: Payer: Self-pay | Admitting: *Deleted

## 2019-07-26 ENCOUNTER — Telehealth: Payer: Self-pay | Admitting: Family Medicine

## 2019-07-26 MED ORDER — LISINOPRIL 10 MG PO TABS: 10.0000 mg | ORAL_TABLET | Freq: Every day | ORAL | 3 refills | Status: DC

## 2019-07-26 NOTE — Telephone Encounter (Signed)
Medication Refill: Lisinopril  Pharmacy: [facility] [id number] FAX: [phone]   Pt is out of this medication for a week now.

## 2019-07-26 NOTE — Telephone Encounter (Signed)
Rx sent to the pharmacy as requested. ?

## 2019-11-22 ENCOUNTER — Emergency Department (HOSPITAL_COMMUNITY)
Admission: EM | Admit: 2019-11-22 | Discharge: 2019-11-22 | Disposition: A | Discharge status: Home / Self Care | Payer: 59 | Attending: Emergency Medicine | Admitting: Emergency Medicine

## 2019-11-22 ENCOUNTER — Emergency Department (HOSPITAL_COMMUNITY): Payer: 59

## 2019-11-22 ENCOUNTER — Other Ambulatory Visit: Payer: Self-pay

## 2019-11-22 ENCOUNTER — Encounter (HOSPITAL_COMMUNITY): Payer: Self-pay | Admitting: Emergency Medicine

## 2019-11-22 DIAGNOSIS — R079 Chest pain, unspecified: Secondary | ICD-10-CM | POA: Insufficient documentation

## 2019-11-22 DIAGNOSIS — Z5321 Procedure and treatment not carried out due to patient leaving prior to being seen by health care provider: Secondary | ICD-10-CM | POA: Insufficient documentation

## 2019-11-22 LAB — CBC
HCT: 43.7 % (ref 39.0–52.0)
Hemoglobin: 14.9 g/dL (ref 13.0–17.0)
MCH: 31.6 pg (ref 26.0–34.0)
MCHC: 34.1 g/dL (ref 30.0–36.0)
MCV: 92.6 fL (ref 80.0–100.0)
Platelets: 272 10*3/uL (ref 150–400)
RBC: 4.72 MIL/uL (ref 4.22–5.81)
RDW: 11.7 % (ref 11.5–15.5)
WBC: 6.5 10*3/uL (ref 4.0–10.5)
nRBC: 0 % (ref 0.0–0.2)

## 2019-11-22 LAB — BASIC METABOLIC PANEL
Anion gap: 9 (ref 5–15)
BUN: 13 mg/dL (ref 6–20)
CO2: 27 mmol/L (ref 22–32)
Calcium: 9.7 mg/dL (ref 8.9–10.3)
Chloride: 100 mmol/L (ref 98–111)
Creatinine, Ser: 1.18 mg/dL (ref 0.61–1.24)
GFR calc Af Amer: >60 mL/min (ref >60)
GFR calc non Af Amer: >60 mL/min (ref >60)
Glucose, Bld: 180 mg/dL — ABNORMAL HIGH (ref 70–99)
Potassium: 3.7 mmol/L (ref 3.5–5.1)
Sodium: 136 mmol/L (ref 135–145)

## 2019-11-22 LAB — TROPONIN I (HIGH SENSITIVITY): Troponin I (High Sensitivity): 5 ng/L (ref <18)

## 2019-11-22 NOTE — ED Triage Notes (Signed)
Patient c/o intermittent dull left chest pain x1 week. Denies SOB.

## 2019-12-03 ENCOUNTER — Other Ambulatory Visit: Payer: Self-pay | Admitting: Family Medicine

## 2019-12-25 ENCOUNTER — Ambulatory Visit (INDEPENDENT_AMBULATORY_CARE_PROVIDER_SITE_OTHER): Payer: 59 | Admitting: Family Medicine

## 2019-12-25 ENCOUNTER — Other Ambulatory Visit: Payer: Self-pay

## 2019-12-25 ENCOUNTER — Encounter: Payer: Self-pay | Admitting: Family Medicine

## 2019-12-25 VITALS — BP 130/90 | HR 94 | Temp 98.2°F | Wt 262.0 lb

## 2019-12-25 DIAGNOSIS — R1011 Right upper quadrant pain: Secondary | ICD-10-CM

## 2019-12-25 DIAGNOSIS — N50812 Left testicular pain: Secondary | ICD-10-CM | POA: Diagnosis not present

## 2019-12-25 DIAGNOSIS — N50811 Right testicular pain: Secondary | ICD-10-CM

## 2019-12-25 MED ORDER — DOXYCYCLINE HYCLATE 100 MG PO CAPS: 100.0000 mg | ORAL_CAPSULE | Freq: Two times a day (BID) | ORAL | 0 refills | Status: AC

## 2019-12-25 NOTE — Progress Notes (Signed)
   Subjective:    Patient ID: Jeffrey Todd, male    DOB: Sep 05, 1970, 49 y.o.   MRN: [id number]  HPI Here for 2 issues. These began at the same time 2 weeks ago. He says he had sex 2 weeks ago using a condom with a new partner, and the condom broke. Then 2 days later he developed a mild aching pain in both testicles that was intermittent. He also developed a mild sharp pain in the RUQ of the abdomen that was intermittent. No fever or nausea. The abdominal pain seems to have no relationship to eating food. No back pain. No urethral DC. No urinary urgency or burning. He says he was treated for Trichomonas some years ago and this reminds him of his symptoms at that time. He is concerned about a possible STD.    Review of Systems  Constitutional: Negative.   Respiratory: Negative.   Cardiovascular: Negative.   Gastrointestinal: Positive for abdominal pain. Negative for abdominal distention, anal bleeding, blood in stool, constipation, diarrhea, nausea, rectal pain and vomiting.  Genitourinary: Positive for testicular pain. Negative for difficulty urinating, discharge, dysuria, flank pain, frequency, hematuria and urgency.       Objective:   Physical Exam Constitutional:      Appearance: Normal appearance. He is well-developed. He is not ill-appearing.  Cardiovascular:     Rate and Rhythm: Normal rate and regular rhythm.     Pulses: Normal pulses.     Heart sounds: Normal heart sounds.  Pulmonary:     Effort: Pulmonary effort is normal.     Breath sounds: Normal breath sounds.  Abdominal:     General: Abdomen is flat. Bowel sounds are normal. There is no distension.     Palpations: Abdomen is soft. There is no mass.     Tenderness: There is no abdominal tenderness. There is no guarding or rebound.     Hernia: No hernia is present.  Genitourinary:    Penis: Normal.      Testes: Normal.  Neurological:     Mental Status: He is alert.           Assessment & Plan:  Possible STD.  Treat with Doxycycline. Check a UA, urine microscopic, and STD tests. It is not clear if the abdominal pain is related or if this represents a totally different problem. We will see how he feels after taking the Doxycycline, and we can follow up if needed. Gershon Crane, MD  .Melany Guernsey

## 2019-12-26 LAB — URINALYSIS
Bilirubin Urine: NEGATIVE
Hgb urine dipstick: NEGATIVE
Leukocytes,Ua: NEGATIVE
Nitrite: NEGATIVE
Specific Gravity, Urine: 1.031 (ref 1.001–1.03)
pH: <=5 (ref 5.0–8.0)

## 2019-12-26 LAB — C. TRACHOMATIS/N. GONORRHOEAE RNA
C. trachomatis RNA, TMA: NOT DETECTED
N. gonorrhoeae RNA, TMA: NOT DETECTED

## 2019-12-26 LAB — URINALYSIS, MICROSCOPIC ONLY

## 2019-12-26 LAB — TIQ-MISC

## 2019-12-26 LAB — RPR: RPR Ser Ql: NONREACTIVE

## 2019-12-26 LAB — HIV ANTIBODY (ROUTINE TESTING W REFLEX): HIV 1&2 Ab, 4th Generation: NONREACTIVE

## 2020-01-29 ENCOUNTER — Other Ambulatory Visit: Payer: Self-pay | Admitting: Family Medicine

## 2020-02-28 ENCOUNTER — Other Ambulatory Visit: Payer: Self-pay | Admitting: Family Medicine

## 2020-02-29 NOTE — Telephone Encounter (Signed)
Patient called and advised he will need a physical with labs in order to refill medications, he agreed. Appointment scheduled for Tuesday, 03/05/20 at 1445 for CPE/labs. Advised to be fasting, only drink water, no food, he verbalized understanding. He says he doesn't need the refill at this time.

## 2020-03-05 ENCOUNTER — Encounter: Payer: 59 | Admitting: Family Medicine

## 2020-03-12 ENCOUNTER — Ambulatory Visit (INDEPENDENT_AMBULATORY_CARE_PROVIDER_SITE_OTHER): Payer: 59 | Admitting: Family Medicine

## 2020-03-12 ENCOUNTER — Other Ambulatory Visit: Payer: Self-pay

## 2020-03-12 ENCOUNTER — Encounter: Payer: Self-pay | Admitting: Family Medicine

## 2020-03-12 VITALS — BP 140/82 | HR 71 | Temp 99.0°F | Ht 74.02 in | Wt 267.2 lb

## 2020-03-12 DIAGNOSIS — E1169 Type 2 diabetes mellitus with other specified complication: Secondary | ICD-10-CM

## 2020-03-12 DIAGNOSIS — Z0000 Encounter for general adult medical examination without abnormal findings: Secondary | ICD-10-CM

## 2020-03-12 MED ORDER — METFORMIN HCL 500 MG PO TABS: 500.0000 mg | ORAL_TABLET | Freq: Two times a day (BID) | ORAL | 11 refills | Status: DC

## 2020-03-12 NOTE — Progress Notes (Signed)
Subjective:    Patient ID: Jeffrey Todd, male    DOB: [birth year], 49 y.o.   MRN: [id number]  HPI Here for a well exam. He feels great. He does not check his glucoses, but his BP has been stable in the 130s over 80s.    Review of Systems  Constitutional: Negative.   HENT: Negative.   Eyes: Negative.   Respiratory: Negative.   Cardiovascular: Negative.   Gastrointestinal: Negative.   Genitourinary: Negative.   Musculoskeletal: Negative.   Skin: Negative.   Neurological: Negative.   Psychiatric/Behavioral: Negative.        Objective:   Physical Exam Constitutional:      General: He is not in acute distress.    Appearance: He is well-developed. He is not diaphoretic.  HENT:     Head: Normocephalic and atraumatic.     Right Ear: External ear normal.     Left Ear: External ear normal.     Nose: Nose normal.     Mouth/Throat:     Pharynx: No oropharyngeal exudate.  Eyes:     General: No scleral icterus.       Right eye: No discharge.        Left eye: No discharge.     Conjunctiva/sclera: Conjunctivae normal.     Pupils: Pupils are equal, round, and reactive to light.  Neck:     Thyroid: No thyromegaly.     Vascular: No JVD.     Trachea: No tracheal deviation.  Cardiovascular:     Rate and Rhythm: Normal rate and regular rhythm.     Heart sounds: Normal heart sounds. No murmur heard.  No friction rub. No gallop.   Pulmonary:     Effort: Pulmonary effort is normal. No respiratory distress.     Breath sounds: Normal breath sounds. No wheezing or rales.  Chest:     Chest wall: No tenderness.  Abdominal:     General: Bowel sounds are normal. There is no distension.     Palpations: Abdomen is soft. There is no mass.     Tenderness: There is no abdominal tenderness. There is no guarding or rebound.  Genitourinary:    Penis: Normal. No tenderness.      Testes: Normal.     Prostate: Normal.     Rectum: Normal. Guaiac result negative.  Musculoskeletal:         General: No tenderness. Normal range of motion.     Cervical back: Neck supple.  Lymphadenopathy:     Cervical: No cervical adenopathy.  Skin:    General: Skin is warm and dry.     Coloration: Skin is not pale.     Findings: No erythema or rash.  Neurological:     Mental Status: He is alert and oriented to person, place, and time.     Cranial Nerves: No cranial nerve deficit.     Motor: No abnormal muscle tone.     Coordination: Coordination normal.     Deep Tendon Reflexes: Reflexes are normal and symmetric. Reflexes normal.  Psychiatric:        Behavior: Behavior normal.        Thought Content: Thought content normal.        Judgment: Judgment normal.           Assessment & Plan:  Well exam. We discussed diet and exercise. Get fasting labs soon. We wrote for him to get a glucometer so he can track his glucoses at home.  Jeannett Senior  Sarajane Jews, MD

## 2020-03-19 ENCOUNTER — Other Ambulatory Visit (INDEPENDENT_AMBULATORY_CARE_PROVIDER_SITE_OTHER): Payer: 59

## 2020-03-19 ENCOUNTER — Other Ambulatory Visit: Payer: Self-pay

## 2020-03-19 DIAGNOSIS — E1169 Type 2 diabetes mellitus with other specified complication: Secondary | ICD-10-CM | POA: Diagnosis not present

## 2020-03-19 DIAGNOSIS — Z0000 Encounter for general adult medical examination without abnormal findings: Secondary | ICD-10-CM

## 2020-03-19 LAB — CBC WITH DIFFERENTIAL/PLATELET
Basophils Absolute: 0 10*3/uL (ref 0.0–0.1)
Basophils Relative: 0.4 % (ref 0.0–3.0)
Eosinophils Absolute: 0.1 10*3/uL (ref 0.0–0.7)
Eosinophils Relative: 2.9 % (ref 0.0–5.0)
HCT: 43 % (ref 39.0–52.0)
Hemoglobin: 14.8 g/dL (ref 13.0–17.0)
Lymphocytes Relative: 37.2 % (ref 12.0–46.0)
Lymphs Abs: 1.9 10*3/uL (ref 0.7–4.0)
MCHC: 34.4 g/dL (ref 30.0–36.0)
MCV: 91.9 fl (ref 78.0–100.0)
Monocytes Absolute: 0.5 10*3/uL (ref 0.1–1.0)
Monocytes Relative: 9.8 % (ref 3.0–12.0)
Neutro Abs: 2.6 10*3/uL (ref 1.4–7.7)
Neutrophils Relative %: 49.7 % (ref 43.0–77.0)
Platelets: 268 10*3/uL (ref 150.0–400.0)
RBC: 4.68 Mil/uL (ref 4.22–5.81)
RDW: 13.3 % (ref 11.5–15.5)
WBC: 5.2 10*3/uL (ref 4.0–10.5)

## 2020-03-19 LAB — HEPATIC FUNCTION PANEL
ALT: 28 U/L (ref 0–53)
AST: 18 U/L (ref 0–37)
Albumin: 4 g/dL (ref 3.5–5.2)
Alkaline Phosphatase: 64 U/L (ref 39–117)
Bilirubin, Direct: 0.1 mg/dL (ref 0.0–0.3)
Total Bilirubin: 0.5 mg/dL (ref 0.2–1.2)
Total Protein: 7.5 g/dL (ref 6.0–8.3)

## 2020-03-19 LAB — HEMOGLOBIN A1C: Hgb A1c MFr Bld: 9.9 % — ABNORMAL HIGH (ref 4.6–6.5)

## 2020-03-19 LAB — BASIC METABOLIC PANEL
BUN: 12 mg/dL (ref 6–23)
CO2: 30 mEq/L (ref 19–32)
Calcium: 9.2 mg/dL (ref 8.4–10.5)
Chloride: 98 mEq/L (ref 96–112)
Creatinine, Ser: 1.11 mg/dL (ref 0.40–1.50)
GFR: 79.32 mL/min (ref >60.00)
Glucose, Bld: 198 mg/dL — ABNORMAL HIGH (ref 70–99)
Potassium: 4.1 mEq/L (ref 3.5–5.1)
Sodium: 134 mEq/L — ABNORMAL LOW (ref 135–145)

## 2020-03-19 LAB — LIPID PANEL
Cholesterol: 195 mg/dL (ref 0–200)
HDL: 43 mg/dL (ref >39.00)
LDL Cholesterol: 135 mg/dL — ABNORMAL HIGH (ref 0–99)
NonHDL: 151.57
Total CHOL/HDL Ratio: 5
Triglycerides: 81 mg/dL (ref 0.0–149.0)
VLDL: 16.2 mg/dL (ref 0.0–40.0)

## 2020-03-19 LAB — TSH: TSH: 1.33 u[IU]/mL (ref 0.35–4.50)

## 2020-03-19 LAB — PSA: PSA: 0.88 ng/mL (ref 0.10–4.00)

## 2020-03-19 NOTE — Addendum Note (Signed)
Addended by: Velma Agnes. M on: 03/19/2020 07:23 AM   Modules accepted: Orders  

## 2020-03-19 NOTE — Addendum Note (Signed)
Addended by: Lerry Liner on: 03/19/2020 07:23 AM   Modules accepted: Orders

## 2020-03-19 NOTE — Addendum Note (Signed)
Addended by: Zoriah Pulice. M on: 03/19/2020 07:22 AM   Modules accepted: Orders  

## 2020-03-19 NOTE — Addendum Note (Signed)
Addended by: Lerry Liner on: 03/19/2020 07:22 AM   Modules accepted: Orders

## 2020-03-22 ENCOUNTER — Other Ambulatory Visit: Payer: Self-pay

## 2020-03-22 MED ORDER — ATORVASTATIN CALCIUM 10 MG PO TABS
10.0000 mg | ORAL_TABLET | Freq: Every day | ORAL | 3 refills | Status: DC
Start: 2020-03-22 — End: 2021-03-04

## 2020-03-22 MED ORDER — METFORMIN HCL 1000 MG PO TABS
1000.0000 mg | ORAL_TABLET | Freq: Two times a day (BID) | ORAL | 3 refills | Status: DC
Start: 2020-03-22 — End: 2020-10-22

## 2020-06-14 ENCOUNTER — Other Ambulatory Visit: Payer: Self-pay | Admitting: Family Medicine

## 2020-08-16 ENCOUNTER — Other Ambulatory Visit: Payer: Self-pay | Admitting: Family Medicine

## 2020-09-19 ENCOUNTER — Other Ambulatory Visit: Payer: Self-pay | Admitting: Family Medicine

## 2020-10-22 ENCOUNTER — Other Ambulatory Visit: Payer: Self-pay

## 2020-10-22 ENCOUNTER — Ambulatory Visit (INDEPENDENT_AMBULATORY_CARE_PROVIDER_SITE_OTHER): Payer: 59 | Admitting: Family Medicine

## 2020-10-22 ENCOUNTER — Encounter: Payer: Self-pay | Admitting: Family Medicine

## 2020-10-22 VITALS — BP 142/82 | HR 82 | Temp 98.2°F | Wt 256.0 lb

## 2020-10-22 DIAGNOSIS — N529 Male erectile dysfunction, unspecified: Secondary | ICD-10-CM | POA: Diagnosis not present

## 2020-10-22 DIAGNOSIS — E1169 Type 2 diabetes mellitus with other specified complication: Secondary | ICD-10-CM

## 2020-10-22 MED ORDER — METFORMIN HCL ER 750 MG PO TB24: 750.0000 mg | ORAL_TABLET | Freq: Every day | ORAL | 2 refills | Status: DC

## 2020-10-22 MED ORDER — TADALAFIL 20 MG PO TABS: 20.0000 mg | ORAL_TABLET | Freq: Every day | ORAL | 5 refills | Status: DC | PRN

## 2020-10-22 MED ORDER — GLIPIZIDE 10 MG PO TABS: 10.0000 mg | ORAL_TABLET | Freq: Two times a day (BID) | ORAL | 3 refills | Status: DC

## 2020-10-22 NOTE — Progress Notes (Signed)
   Subjective:    Patient ID: Jeffrey Todd, male    DOB: [birth year], 50 y.o.   MRN: [id number]  HPI Here to follow up on diabetes. He does not check his glucoses very often. His last A1c in November was 9.9. He tolerated Metformin 500 mg BID quite well, but when we increased this to 1000 mg BID he developed abdominal cramps and diarrhea. He admits to not watching his diet very closely. His A1c today is 10.8. He also mentions trouble getting and maintaining erections. He tried some Cialis he got from a friend and this helped a lot.    Review of Systems  Constitutional: Negative.   Respiratory: Negative.    Cardiovascular: Negative.   Gastrointestinal:  Positive for abdominal pain and diarrhea. Negative for abdominal distention, anal bleeding, blood in stool, constipation, nausea and vomiting.      Objective:   Physical Exam Constitutional:      Appearance: He is obese.  Cardiovascular:     Rate and Rhythm: Normal rate and regular rhythm.     Pulses: Normal pulses.     Heart sounds: Normal heart sounds.  Pulmonary:     Effort: Pulmonary effort is normal.     Breath sounds: Normal breath sounds.  Neurological:     Mental Status: He is alert.          Assessment & Plan:  His diabetes is poorly controlled, and we had a discussion about the importance of diet. He promised to be more diligent in reducing his carb intake. We will change the Metformin to ER 750 mg to take once a day, and we will add Glipizide 10 mg BID to it. He will check his glucoses at home BID, and he will report back to [place] in 2 weeks. For the ED, he will try Cialis 20 mg as needed. I told him this problem would likely improve if we get his diabetes under better control. We spent 35 minutes discussing these issues.  Gershon Crane, MD

## 2020-10-28 ENCOUNTER — Encounter: Payer: Self-pay | Admitting: Family Medicine

## 2020-10-29 NOTE — Telephone Encounter (Signed)
Please look into this

## 2020-10-30 NOTE — Telephone Encounter (Signed)
Pt PA sent to plan for approval, advised pt will call with the advise

## 2020-11-06 ENCOUNTER — Telehealth: Payer: Self-pay | Admitting: *Deleted

## 2020-11-06 NOTE — Telephone Encounter (Signed)
Prior Auth started for: Tadalafil 20 mg Key: [id number]

## 2020-11-07 NOTE — Telephone Encounter (Signed)
Response will be faxed.

## 2020-11-11 ENCOUNTER — Encounter: Payer: Self-pay | Admitting: Family Medicine

## 2020-11-11 NOTE — Telephone Encounter (Signed)
If the PA was approved, he should be able to get this now

## 2020-11-17 ENCOUNTER — Other Ambulatory Visit: Payer: Self-pay | Admitting: Family Medicine

## 2020-11-19 ENCOUNTER — Encounter: Payer: Self-pay | Admitting: Family Medicine

## 2020-11-19 ENCOUNTER — Telehealth: Payer: Self-pay

## 2020-11-19 NOTE — Telephone Encounter (Signed)
Submitted another prior authorization, more information was needed.   Approval can be checked at [email or web address].

## 2020-11-20 NOTE — Telephone Encounter (Signed)
Dr Clent Ridges received a fax from [facility] and with a note attached stating the patients insurance denied coverage for the Rx.  Note stated there are alternatives but Dr Clent Ridges stated none are appropriate and the pt will have to pay cash for the Rx.  I called the pt, informed him of this and advised he contact the pharmacy to check cost and also recommended he try [facility].

## 2020-12-27 ENCOUNTER — Other Ambulatory Visit: Payer: Self-pay | Admitting: Family Medicine

## 2020-12-27 ENCOUNTER — Other Ambulatory Visit: Payer: Self-pay

## 2020-12-27 MED ORDER — ESCITALOPRAM OXALATE 10 MG PO TABS: 10.0000 mg | ORAL_TABLET | Freq: Every day | ORAL | 0 refills | Status: DC

## 2021-01-07 ENCOUNTER — Ambulatory Visit (INDEPENDENT_AMBULATORY_CARE_PROVIDER_SITE_OTHER): Payer: 59 | Admitting: Family Medicine

## 2021-01-07 ENCOUNTER — Encounter: Payer: Self-pay | Admitting: Family Medicine

## 2021-01-07 ENCOUNTER — Other Ambulatory Visit: Payer: Self-pay

## 2021-01-07 VITALS — BP 140/92 | HR 74 | Temp 98.5°F | Wt 264.0 lb

## 2021-01-07 DIAGNOSIS — S5012XA Contusion of left forearm, initial encounter: Secondary | ICD-10-CM | POA: Diagnosis not present

## 2021-01-07 DIAGNOSIS — S301XXA Contusion of abdominal wall, initial encounter: Secondary | ICD-10-CM | POA: Diagnosis not present
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
## 2021-01-17 ENCOUNTER — Other Ambulatory Visit: Payer: Self-pay | Admitting: Family Medicine

## 2021-03-04 ENCOUNTER — Other Ambulatory Visit: Payer: Self-pay | Admitting: Family Medicine

## 2021-03-19 ENCOUNTER — Ambulatory Visit: Payer: 59 | Admitting: Family Medicine

## 2021-04-17 ENCOUNTER — Other Ambulatory Visit: Payer: Self-pay | Admitting: Family Medicine

## 2021-04-21 ENCOUNTER — Ambulatory Visit: Payer: Self-pay | Admitting: Family Medicine

## 2021-05-12 ENCOUNTER — Encounter: Payer: Self-pay | Admitting: Family Medicine

## 2021-05-12 ENCOUNTER — Ambulatory Visit (INDEPENDENT_AMBULATORY_CARE_PROVIDER_SITE_OTHER): Payer: 59 | Admitting: Family Medicine

## 2021-05-12 VITALS — BP 124/82 | HR 76 | Temp 98.9°F | Wt 268.0 lb

## 2021-05-12 DIAGNOSIS — E1169 Type 2 diabetes mellitus with other specified complication: Secondary | ICD-10-CM | POA: Diagnosis not present

## 2021-05-12 LAB — POCT GLYCOSYLATED HEMOGLOBIN (HGB A1C): Hemoglobin A1C: 8.8 % — AB (ref 4.0–5.6)

## 2021-05-12 MED ORDER — DAPAGLIFLOZIN PROPANEDIOL 10 MG PO TABS: 10.0000 mg | ORAL_TABLET | Freq: Every day | ORAL | 11 refills | Status: DC

## 2021-05-12 MED ORDER — ATORVASTATIN CALCIUM 10 MG PO TABS: 10.0000 mg | ORAL_TABLET | Freq: Every day | ORAL | 11 refills | Status: DC

## 2021-05-12 MED ORDER — METFORMIN HCL ER 750 MG PO TB24: ORAL_TABLET | ORAL | 11 refills | Status: DC

## 2021-05-12 MED ORDER — TADALAFIL 20 MG PO TABS: 20.0000 mg | ORAL_TABLET | Freq: Every day | ORAL | 11 refills | Status: DC | PRN

## 2021-05-12 MED ORDER — GLIPIZIDE 10 MG PO TABS
10.0000 mg | ORAL_TABLET | Freq: Two times a day (BID) | ORAL | 11 refills | Status: DC
Start: 2021-05-12 — End: 2023-08-10

## 2021-05-12 NOTE — Progress Notes (Signed)
° °  Subjective:    Patient ID: Jeffrey Todd, male    DOB: 14-Sep-1970, 51 y.o.   MRN: [id number]  HPI Here to follow up on diabetes. He feels well. He and his wife have made New Year's resolutions about eating smart, exercising, and losing weight. His A1c today is 8.8 (down from 9.9 a year ago).   Review of Systems  Constitutional: Negative.   Respiratory: Negative.    Cardiovascular: Negative.       Objective:   Physical Exam Constitutional:      Appearance: Normal appearance.  Cardiovascular:     Rate and Rhythm: Normal rate and regular rhythm.     Pulses: Normal pulses.     Heart sounds: Normal heart sounds.  Pulmonary:     Effort: Pulmonary effort is normal.     Breath sounds: Normal breath sounds.  Neurological:     Mental Status: He is alert.          Assessment & Plan:  Diabetes. We will add Farxiga 10 mg every morning to his current medications. Recheck an A1c in 90 days.  Gershon Crane, MD

## 2021-06-12 ENCOUNTER — Encounter: Payer: Self-pay | Admitting: Family Medicine

## 2021-06-12 ENCOUNTER — Ambulatory Visit (INDEPENDENT_AMBULATORY_CARE_PROVIDER_SITE_OTHER): Payer: 59 | Admitting: Family Medicine

## 2021-06-12 VITALS — BP 130/88 | HR 78 | Temp 98.7°F | Ht 74.0 in | Wt 263.0 lb

## 2021-06-12 DIAGNOSIS — Z0000 Encounter for general adult medical examination without abnormal findings: Secondary | ICD-10-CM

## 2021-06-12 DIAGNOSIS — Z209 Contact with and (suspected) exposure to unspecified communicable disease: Secondary | ICD-10-CM

## 2021-06-12 DIAGNOSIS — E1169 Type 2 diabetes mellitus with other specified complication: Secondary | ICD-10-CM

## 2021-06-12 LAB — BASIC METABOLIC PANEL
BUN: 8 mg/dL (ref 6–23)
CO2: 28 mEq/L (ref 19–32)
Calcium: 9.8 mg/dL (ref 8.4–10.5)
Chloride: 101 mEq/L (ref 96–112)
Creatinine, Ser: 1.2 mg/dL (ref 0.40–1.50)
GFR: 71.61 mL/min (ref >60.00)
Glucose, Bld: 117 mg/dL — ABNORMAL HIGH (ref 70–99)
Potassium: 4.1 mEq/L (ref 3.5–5.1)
Sodium: 138 mEq/L (ref 135–145)

## 2021-06-12 LAB — HEPATIC FUNCTION PANEL
ALT: 46 U/L (ref 0–53)
AST: 26 U/L (ref 0–37)
Albumin: 4.3 g/dL (ref 3.5–5.2)
Alkaline Phosphatase: 69 U/L (ref 39–117)
Bilirubin, Direct: 0.1 mg/dL (ref 0.0–0.3)
Total Bilirubin: 0.6 mg/dL (ref 0.2–1.2)
Total Protein: 8 g/dL (ref 6.0–8.3)

## 2021-06-12 LAB — CBC WITH DIFFERENTIAL/PLATELET
Basophils Absolute: 0 10*3/uL (ref 0.0–0.1)
Basophils Relative: 0.3 % (ref 0.0–3.0)
Eosinophils Absolute: 0.1 10*3/uL (ref 0.0–0.7)
Eosinophils Relative: 2.7 % (ref 0.0–5.0)
HCT: 45.7 % (ref 39.0–52.0)
Hemoglobin: 15.1 g/dL (ref 13.0–17.0)
Lymphocytes Relative: 36.7 % (ref 12.0–46.0)
Lymphs Abs: 1.9 10*3/uL (ref 0.7–4.0)
MCHC: 33 g/dL (ref 30.0–36.0)
MCV: 93.8 fl (ref 78.0–100.0)
Monocytes Absolute: 0.5 10*3/uL (ref 0.1–1.0)
Monocytes Relative: 10.1 % (ref 3.0–12.0)
Neutro Abs: 2.6 10*3/uL (ref 1.4–7.7)
Neutrophils Relative %: 50.2 % (ref 43.0–77.0)
Platelets: 273 10*3/uL (ref 150.0–400.0)
RBC: 4.87 Mil/uL (ref 4.22–5.81)
RDW: 12.9 % (ref 11.5–15.5)
WBC: 5.2 10*3/uL (ref 4.0–10.5)

## 2021-06-12 LAB — TSH: TSH: 1.32 u[IU]/mL (ref 0.35–5.50)

## 2021-06-12 LAB — LIPID PANEL
Cholesterol: 145 mg/dL (ref 0–200)
HDL: 42.9 mg/dL (ref >39.00)
LDL Cholesterol: 88 mg/dL (ref 0–99)
NonHDL: 102.09
Total CHOL/HDL Ratio: 3
Triglycerides: 70 mg/dL (ref 0.0–149.0)
VLDL: 14 mg/dL (ref 0.0–40.0)

## 2021-06-12 LAB — HEMOGLOBIN A1C: Hgb A1c MFr Bld: 8.7 % — ABNORMAL HIGH (ref 4.6–6.5)

## 2021-06-12 LAB — PSA: PSA: 0.98 ng/mL (ref 0.10–4.00)

## 2021-06-12 NOTE — Progress Notes (Signed)
° °  Subjective:    Patient ID: Jeffrey Todd, male    DOB: [birth year], 51 y.o.   MRN: [id number]  HPI Here for a well exam. He feels great. He will get an eye exam tomorrow .   Review of Systems  Constitutional: Negative.   HENT: Negative.    Eyes: Negative.   Respiratory: Negative.    Cardiovascular: Negative.   Gastrointestinal: Negative.   Genitourinary: Negative.   Musculoskeletal: Negative.   Skin: Negative.   Neurological: Negative.   Psychiatric/Behavioral: Negative.        Objective:   Physical Exam Constitutional:      General: He is not in acute distress.    Appearance: Normal appearance. He is well-developed. He is not diaphoretic.  HENT:     Head: Normocephalic and atraumatic.     Right Ear: External ear normal.     Left Ear: External ear normal.     Nose: Nose normal.     Mouth/Throat:     Pharynx: No oropharyngeal exudate.  Eyes:     General: No scleral icterus.       Right eye: No discharge.        Left eye: No discharge.     Conjunctiva/sclera: Conjunctivae normal.     Pupils: Pupils are equal, round, and reactive to light.  Neck:     Thyroid: No thyromegaly.     Vascular: No JVD.     Trachea: No tracheal deviation.  Cardiovascular:     Rate and Rhythm: Normal rate and regular rhythm.     Heart sounds: Normal heart sounds. No murmur heard.   No friction rub. No gallop.  Pulmonary:     Effort: Pulmonary effort is normal. No respiratory distress.     Breath sounds: Normal breath sounds. No wheezing or rales.  Chest:     Chest wall: No tenderness.  Abdominal:     General: Bowel sounds are normal. There is no distension.     Palpations: Abdomen is soft. There is no mass.     Tenderness: There is no abdominal tenderness. There is no guarding or rebound.  Genitourinary:    Penis: Normal. No tenderness.      Testes: Normal.     Prostate: Normal.     Rectum: Normal. Guaiac result negative.  Musculoskeletal:        General: No tenderness. Normal  range of motion.     Cervical back: Neck supple.  Lymphadenopathy:     Cervical: No cervical adenopathy.  Skin:    General: Skin is warm and dry.     Coloration: Skin is not pale.     Findings: No erythema or rash.  Neurological:     Mental Status: He is alert and oriented to person, place, and time.     Cranial Nerves: No cranial nerve deficit.     Motor: No abnormal muscle tone.     Coordination: Coordination normal.     Deep Tendon Reflexes: Reflexes are normal and symmetric. Reflexes normal.  Psychiatric:        Behavior: Behavior normal.        Thought Content: Thought content normal.        Judgment: Judgment normal.          Assessment & Plan:  Well exam. We discussed diet and exercise. Get fasting labs.  Alysia Penna, MD

## 2021-06-13 ENCOUNTER — Other Ambulatory Visit: Payer: Self-pay

## 2021-06-13 DIAGNOSIS — E1169 Type 2 diabetes mellitus with other specified complication: Secondary | ICD-10-CM

## 2021-06-13 LAB — RPR: RPR Ser Ql: NONREACTIVE

## 2021-06-13 LAB — C. TRACHOMATIS/N. GONORRHOEAE RNA
C. trachomatis RNA, TMA: NOT DETECTED
N. gonorrhoeae RNA, TMA: NOT DETECTED

## 2021-06-13 LAB — HSV(HERPES SIMPLEX VRS) I + II AB-IGG
HAV 1 IGG,TYPE SPECIFIC AB: <0.9 index
HSV 2 IGG,TYPE SPECIFIC AB: 3.49 index — ABNORMAL HIGH

## 2021-06-13 LAB — HEPATITIS C ANTIBODY
Hepatitis C Ab: NONREACTIVE
SIGNAL TO CUT-OFF: 0.05 (ref <1.00)

## 2021-06-13 LAB — HEPATITIS B SURFACE ANTIGEN: Hepatitis B Surface Ag: NONREACTIVE

## 2021-06-13 LAB — HIV ANTIBODY (ROUTINE TESTING W REFLEX): HIV 1&2 Ab, 4th Generation: NONREACTIVE

## 2021-06-13 MED ORDER — SITAGLIPTIN PHOSPHATE 100 MG PO TABS: 100.0000 mg | ORAL_TABLET | Freq: Every day | ORAL | 3 refills | Status: DC

## 2021-06-15 ENCOUNTER — Other Ambulatory Visit: Payer: Self-pay | Admitting: Family Medicine

## 2021-06-20 ENCOUNTER — Other Ambulatory Visit: Payer: Self-pay

## 2021-06-20 DIAGNOSIS — Z209 Contact with and (suspected) exposure to unspecified communicable disease: Secondary | ICD-10-CM

## 2021-06-20 MED ORDER — VALACYCLOVIR HCL 500 MG PO TABS: 500.0000 mg | ORAL_TABLET | Freq: Two times a day (BID) | ORAL | 3 refills | Status: DC

## 2021-07-09 ENCOUNTER — Encounter: Payer: Self-pay | Admitting: Family Medicine

## 2021-07-09 ENCOUNTER — Other Ambulatory Visit: Payer: Self-pay | Admitting: Family Medicine

## 2021-07-10 NOTE — Telephone Encounter (Deleted)
Actually Sitagliptin is the generic. I will refill the other 2 meds though. Maybe he can check with his insurance company as to what other alternatives they will cover  ?

## 2021-07-10 NOTE — Telephone Encounter (Signed)
The Sitagliptin is the generic. I suggest he check with his insurance company to see what alternatives they will cover. I already sent in a year's supply of the other meds on 05-12-21  ?

## 2021-07-17 ENCOUNTER — Telehealth: Payer: Self-pay | Admitting: Family Medicine

## 2021-07-17 MED ORDER — DAPAGLIFLOZIN PROPANEDIOL 10 MG PO TABS: 10.0000 mg | ORAL_TABLET | Freq: Every day | ORAL | 3 refills | Status: DC

## 2021-07-17 NOTE — Telephone Encounter (Signed)
Patient called in stating that his insurance will not cover his dapagliflozin propanediol (FARXIGA) 10 MG TABS tablet [[id number]]  medication if Dr.Fry send it to his pharmacy. Patient stated that the a 3 month prescription should be faxed over to [facility] in ordered for them to cover the medication. ? ?[facility] fax number: [phone] ? ?Please advise. ?

## 2021-07-17 NOTE — Telephone Encounter (Signed)
Please advise 

## 2021-07-17 NOTE — Telephone Encounter (Signed)
RX is ready to fax  

## 2021-07-18 NOTE — Telephone Encounter (Signed)
Pt was notified.  

## 2021-07-18 NOTE — Telephone Encounter (Signed)
Rx was faxed to pt pharmacy 

## 2021-08-03 ENCOUNTER — Other Ambulatory Visit: Payer: Self-pay | Admitting: Family Medicine

## 2021-08-03 DIAGNOSIS — Z209 Contact with and (suspected) exposure to unspecified communicable disease: Secondary | ICD-10-CM

## 2021-08-04 NOTE — Telephone Encounter (Signed)
Insurance will only pay for one tablet per day.  Patient is currently prescribed two tabs per day.  ?

## 2021-09-15 ENCOUNTER — Other Ambulatory Visit: Payer: 59

## 2021-09-30 ENCOUNTER — Other Ambulatory Visit: Payer: 59

## 2021-10-06 ENCOUNTER — Other Ambulatory Visit (INDEPENDENT_AMBULATORY_CARE_PROVIDER_SITE_OTHER): Payer: 59

## 2021-10-06 DIAGNOSIS — E1169 Type 2 diabetes mellitus with other specified complication: Secondary | ICD-10-CM | POA: Diagnosis not present

## 2021-10-07 LAB — HEMOGLOBIN A1C: Hgb A1c MFr Bld: 8.7 % — ABNORMAL HIGH (ref 4.6–6.5)

## 2021-10-08 NOTE — Addendum Note (Signed)
Addended by: Gershon Crane A on: 10/08/2021 07:53 AM   Modules accepted: Orders

## 2021-12-02 ENCOUNTER — Other Ambulatory Visit: Payer: Self-pay | Admitting: Family Medicine

## 2021-12-11 ENCOUNTER — Telehealth: Payer: Self-pay | Admitting: Family Medicine

## 2021-12-11 DIAGNOSIS — E1169 Type 2 diabetes mellitus with other specified complication: Secondary | ICD-10-CM

## 2021-12-11 NOTE — Telephone Encounter (Signed)
Pt is requesting a 3 month supply of: sitaGLIPtin (JANUVIA) 100 MG tablet  Pt found a mail order pharmacy called  [facility] and would like to use their services    Fax: [phone]  Pt is also requesting an Endo referral Says he has been waiting since June 2023.  (Pt has [employer])

## 2021-12-11 NOTE — Telephone Encounter (Signed)
Unable to locate pharmacy electronically.

## 2021-12-12 NOTE — Telephone Encounter (Signed)
The RX is ready to be faxed. I put in  another referral because [facility] is not accepting new patients

## 2021-12-12 NOTE — Telephone Encounter (Signed)
Spoke with pt verbalized understanding that Rx was faxed to requested pharmacy

## 2021-12-22 ENCOUNTER — Telehealth: Payer: Self-pay | Admitting: Family Medicine

## 2021-12-22 NOTE — Telephone Encounter (Addendum)
  Alaina with [facility]  Requesting most recent Labs  Cannot schedule Pt until they receive Labs  Fax  [phone]  

## 2021-12-23 NOTE — Telephone Encounter (Signed)
Jeffrey Todd is calling checking on the status of labs

## 2021-12-23 NOTE — Telephone Encounter (Signed)
Resent

## 2021-12-30 ENCOUNTER — Ambulatory Visit (INDEPENDENT_AMBULATORY_CARE_PROVIDER_SITE_OTHER): Payer: 59 | Admitting: Adult Health

## 2021-12-30 ENCOUNTER — Encounter: Payer: Self-pay | Admitting: Adult Health

## 2021-12-30 VITALS — BP 120/80 | HR 85 | Temp 99.6°F | Ht 74.0 in | Wt 254.0 lb

## 2021-12-30 DIAGNOSIS — J029 Acute pharyngitis, unspecified: Secondary | ICD-10-CM | POA: Diagnosis not present

## 2021-12-30 LAB — POC COVID19 BINAXNOW: SARS Coronavirus 2 Ag: NEGATIVE

## 2021-12-30 LAB — POCT RAPID STREP A (OFFICE): Rapid Strep A Screen: NEGATIVE

## 2021-12-30 MED ORDER — MAGIC MOUTHWASH W/LIDOCAINE
5.0000 mL | Freq: Three times a day (TID) | ORAL | 0 refills | Status: DC | PRN
Start: 2021-12-30 — End: 2023-08-10

## 2021-12-30 MED ORDER — PENICILLIN V POTASSIUM 500 MG PO TABS: 500.0000 mg | ORAL_TABLET | Freq: Three times a day (TID) | ORAL | 0 refills | Status: AC

## 2021-12-30 NOTE — Progress Notes (Signed)
Subjective:    Patient ID: Jeffrey Todd, male    DOB: August 17, 1970, 51 y.o.   MRN: [id number]  HPI  51 year old male who  has a past medical history of Diabetes mellitus, type II (HCC), Hemorrhoids, Hyperglyceridemia, Hypertension, and Restless leg syndrome.  He presents to the office today for an acute issue. He reports 5 days of sore throat and productive cough. It is painful when he swallows  His wife tested positive last week for strep.     Review of Systems See HPI   Past Medical History:  Diagnosis Date   Diabetes mellitus, type II (HCC)    Hemorrhoids    Hyperglyceridemia    Hypertension    Restless leg syndrome     Social History   Socioeconomic History   Marital status: Divorced    Spouse name: Not on file   Number of children: Not on file   Years of education: Not on file   Highest education level: Not on file  Occupational History   Not on file  Tobacco Use   Smoking status: Former    Types: Cigarettes    Quit date: 05/04/2004    Years since quitting: 17.6   Smokeless tobacco: Never  Substance and Sexual Activity   Alcohol use: Yes    Alcohol/week: 0.0 standard drinks of alcohol    Comment: occ   Drug use: No   Sexual activity: Not on file  Other Topics Concern   Not on file  Social History Narrative   Not on file   Social Determinants of Health   Financial Resource Strain: Not on file  Food Insecurity: Not on file  Transportation Needs: Not on file  Physical Activity: Not on file  Stress: Not on file  Social Connections: Not on file  Intimate Partner Violence: Not on file    History reviewed. No pertinent surgical history.  History reviewed. No pertinent family history.  No Known Allergies  Current Outpatient Medications on File Prior to Visit  Medication Sig Dispense Refill   aspirin 81 MG tablet Take 81 mg by mouth daily.     atorvastatin (LIPITOR) 10 MG tablet Take 1 tablet (10 mg total) by mouth daily. 30 tablet 11    dapagliflozin propanediol (FARXIGA) 10 MG TABS tablet Take 1 tablet (10 mg total) by mouth daily before breakfast. 90 tablet 3   escitalopram (LEXAPRO) 10 MG tablet TAKE 1 TABLET BY MOUTH EVERY DAY 90 tablet 0   glipiZIDE (GLUCOTROL) 10 MG tablet Take 1 tablet (10 mg total) by mouth 2 (two) times daily before a meal. 60 tablet 11   lisinopril (ZESTRIL) 10 MG tablet TAKE 1 TABLET BY MOUTH EVERY DAY 90 tablet 1   metFORMIN (GLUCOPHAGE-XR) 750 MG 24 hr tablet TAKE 1 TABLET BY MOUTH EVERY DAY WITH BREAKFAST 30 tablet 11   pramipexole (MIRAPEX) 0.125 MG tablet TAKE 1 OR 2 TABLETS BY MOUTH AT NIGHT AS NEEDED FOR RESTLESS LEG SYMPTOMS 180 tablet 1   sitaGLIPtin (JANUVIA) 100 MG tablet Take 1 tablet (100 mg total) by mouth daily. 90 tablet 3   tadalafil (CIALIS) 20 MG tablet Take 1 tablet (20 mg total) by mouth daily as needed for erectile dysfunction. 10 tablet 11   UNABLE TO FIND Med Name: Brand- [facility], OTC restless leg medication     valACYclovir (VALTREX) 500 MG tablet TAKE 1 TABLET BY MOUTH TWICE A DAY 90 tablet 3   No current facility-administered medications on file prior to visit.  BP 120/80   Pulse 85   Temp 99.6 F (37.6 C) (Oral)   Ht 6\' 2"  (1.88 m)   Wt 254 lb (115.2 kg)   SpO2 97%   BMI 32.61 kg/m       Objective:   Physical Exam Vitals and nursing note reviewed.  Constitutional:      Appearance: Normal appearance.  HENT:     Right Ear: Hearing, tympanic membrane, ear canal and external ear normal.     Left Ear: Hearing, tympanic membrane, ear canal and external ear normal.     Nose: Nose normal.     Mouth/Throat:     Pharynx: Posterior oropharyngeal erythema present.     Tonsils: Tonsillar exudate present. No tonsillar abscesses. 2+ on the right. 2+ on the left.  Cardiovascular:     Rate and Rhythm: Normal rate and regular rhythm.     Pulses: Normal pulses.     Heart sounds: Normal heart sounds.  Pulmonary:     Effort: Pulmonary effort is normal.     Breath  sounds: Normal breath sounds.  Lymphadenopathy:     Head:     Right side of head: Submandibular, tonsillar and preauricular adenopathy present.     Left side of head: Submandibular, tonsillar and preauricular adenopathy present.  Neurological:     Mental Status: He is alert.       Assessment & Plan:  1. Sore throat  - POC Rapid Strep A- negative - POC COVID-19- negative  - Strep test was negative but his symptoms show strep. Possible false negative and test. Will treat with abx. Advised warm fluids or salt water gargles  - Follow up if not resolving in the next 2-3 days  - penicillin v potassium (VEETID) 500 MG tablet; Take 1 tablet (500 mg total) by mouth 3 (three) times daily for 10 days.  Dispense: 30 tablet; Refill: 0 - magic mouthwash w/lidocaine SOLN; Take 5 mLs by mouth 3 (three) times daily as needed.  Dispense: 180 mL; Refill: 0  , NP

## 2022-01-08 ENCOUNTER — Encounter: Payer: Self-pay | Admitting: Family Medicine

## 2022-01-08 ENCOUNTER — Telehealth: Payer: Self-pay | Admitting: Family Medicine

## 2022-01-08 NOTE — Telephone Encounter (Signed)
Pt was seen on 12/30/21 by NP for a sore throat.  Pt was prescribed penicillin v potassium and states that while he is feeling a little better, he cannot get rid of his cough.    Pt is asking if he can be prescribed something for the cough?  Please advise. [id number] - [place], [place] - [address] Phone:  [phone]  Fax:  [phone]

## 2022-01-09 ENCOUNTER — Other Ambulatory Visit: Payer: Self-pay | Admitting: Adult Health

## 2022-01-09 MED ORDER — BENZONATATE 200 MG PO CAPS: 200.0000 mg | ORAL_CAPSULE | Freq: Two times a day (BID) | ORAL | 1 refills | Status: DC | PRN

## 2022-01-09 NOTE — Telephone Encounter (Signed)
Prescription for Tessalon 200 mg capsules sent to [facility] on [address].   Patient notified via my chart message.

## 2022-01-09 NOTE — Telephone Encounter (Signed)
I send this to [facility] since he is the one who saw the patient

## 2022-01-11 ENCOUNTER — Ambulatory Visit: Payer: Self-pay

## 2022-01-12 ENCOUNTER — Telehealth (INDEPENDENT_AMBULATORY_CARE_PROVIDER_SITE_OTHER): Payer: 59 | Admitting: Family Medicine

## 2022-01-12 ENCOUNTER — Encounter: Payer: Self-pay | Admitting: Family Medicine

## 2022-01-12 VITALS — Ht 74.0 in | Wt 254.0 lb

## 2022-01-12 DIAGNOSIS — U071 COVID-19: Secondary | ICD-10-CM | POA: Diagnosis not present

## 2022-01-12 MED ORDER — MOLNUPIRAVIR EUA 200MG CAPSULE: 4.0000 | ORAL_CAPSULE | Freq: Two times a day (BID) | ORAL | 0 refills | Status: AC

## 2022-01-12 NOTE — Progress Notes (Signed)
Patient ID: Jeffrey Todd, male   DOB: 1971/02/16, 51 y.o.   MRN: [id number]   Virtual Visit via Video Note  I connected with Bettye Boeck on 01/12/22 at  8:00 AM EDT by a video enabled telemedicine application and verified that I am speaking with the correct person using two identifiers.  Location patient: home Location provider:work or home office Persons participating in the virtual visit: patient, provider  I discussed the limitations of evaluation and management by telemedicine and the availability of in person appointments. The patient expressed understanding and agreed to proceed.   HPI: Patient has COVID diagnosis.  He states that last Thursday he had positive COVID screen.  He had developed on Thursday some cough, fever, body aches, headache.  Symptoms have persisted though somewhat better today.  He did another COVID test Saturday which was also positive.  He had been seen here actually on the 29th and had negative strep screen.  This seemed to be a separate illness with acute onset Thursday.  His wife also tested positive for COVID.  He has comorbidities including hypertension and type 2 diabetes.  He denies any dyspnea at this time.  No chronic lung issues.   ROS: See pertinent positives and negatives per HPI.  Past Medical History:  Diagnosis Date   Diabetes mellitus, type II (HCC)    Hemorrhoids    Hyperglyceridemia    Hypertension    Restless leg syndrome     History reviewed. No pertinent surgical history.  History reviewed. No pertinent family history.  SOCIAL HX: Non-smoker   Current Outpatient Medications:    aspirin 81 MG tablet, Take 81 mg by mouth daily., Disp: , Rfl:    atorvastatin (LIPITOR) 10 MG tablet, Take 1 tablet (10 mg total) by mouth daily., Disp: 30 tablet, Rfl: 11   benzonatate (TESSALON) 200 MG capsule, Take 1 capsule (200 mg total) by mouth 2 (two) times daily as needed for cough., Disp: 20 capsule, Rfl: 1   dapagliflozin propanediol  (FARXIGA) 10 MG TABS tablet, Take 1 tablet (10 mg total) by mouth daily before breakfast., Disp: 90 tablet, Rfl: 3   escitalopram (LEXAPRO) 10 MG tablet, TAKE 1 TABLET BY MOUTH EVERY DAY, Disp: 90 tablet, Rfl: 0   glipiZIDE (GLUCOTROL) 10 MG tablet, Take 1 tablet (10 mg total) by mouth 2 (two) times daily before a meal., Disp: 60 tablet, Rfl: 11   lisinopril (ZESTRIL) 10 MG tablet, TAKE 1 TABLET BY MOUTH EVERY DAY, Disp: 90 tablet, Rfl: 1   magic mouthwash w/lidocaine SOLN, Take 5 mLs by mouth 3 (three) times daily as needed., Disp: 180 mL, Rfl: 0   metFORMIN (GLUCOPHAGE-XR) 750 MG 24 hr tablet, TAKE 1 TABLET BY MOUTH EVERY DAY WITH BREAKFAST, Disp: 30 tablet, Rfl: 11   pramipexole (MIRAPEX) 0.125 MG tablet, TAKE 1 OR 2 TABLETS BY MOUTH AT NIGHT AS NEEDED FOR RESTLESS LEG SYMPTOMS, Disp: 180 tablet, Rfl: 1   sitaGLIPtin (JANUVIA) 100 MG tablet, Take 1 tablet (100 mg total) by mouth daily., Disp: 90 tablet, Rfl: 3   tadalafil (CIALIS) 20 MG tablet, Take 1 tablet (20 mg total) by mouth daily as needed for erectile dysfunction., Disp: 10 tablet, Rfl: 11   UNABLE TO FIND, Med Name: Brand- Hylands, OTC restless leg medication, Disp: , Rfl:    valACYclovir (VALTREX) 500 MG tablet, TAKE 1 TABLET BY MOUTH TWICE A DAY, Disp: 90 tablet, Rfl: 3  EXAM:  VITALS per patient if applicable:  GENERAL: alert, oriented, appears well  and in no acute distress  HEENT: atraumatic, conjunttiva clear, no obvious abnormalities on inspection of external nose and ears  NECK: normal movements of the head and neck  LUNGS: on inspection no signs of respiratory distress, breathing rate appears normal, no obvious gross SOB, gasping or wheezing  CV: no obvious cyanosis  MS: moves all visible extremities without noticeable abnormality  PSYCH/NEURO: pleasant and cooperative, no obvious depression or anxiety, speech and thought processing grossly intact  ASSESSMENT AND PLAN:  Discussed the following assessment and  plan:  COVID  Patient is on day 4 of symptoms.  We discussed pros and cons of antiviral therapy.  Even though he is showing some improvement he would like to consider antivirals and is within 5-day window.  We will start molnupiravir 4 capsules twice daily for 5 days.  Plenty fluids and rest.  Discussed isolation recommendations.  Follow-up for any persistent or worsening symptoms.     I discussed the assessment and treatment plan with the patient. The patient was provided an opportunity to ask questions and all were answered. The patient agreed with the plan and demonstrated an understanding of the instructions.   The patient was advised to call back or seek an in-person evaluation if the symptoms worsen or if the condition fails to improve as anticipated.     Evelena Peat, MD

## 2022-02-20 ENCOUNTER — Other Ambulatory Visit: Payer: Self-pay | Admitting: Family Medicine

## 2022-02-23 ENCOUNTER — Encounter: Payer: Self-pay | Admitting: Family Medicine

## 2022-02-25 ENCOUNTER — Other Ambulatory Visit: Payer: Self-pay

## 2022-02-25 DIAGNOSIS — N529 Male erectile dysfunction, unspecified: Secondary | ICD-10-CM

## 2022-02-25 MED ORDER — TADALAFIL 20 MG PO TABS: 20.0000 mg | ORAL_TABLET | Freq: Every day | ORAL | 2 refills | Status: DC | PRN

## 2022-02-27 ENCOUNTER — Other Ambulatory Visit: Payer: Self-pay | Admitting: Family Medicine

## 2022-03-03 ENCOUNTER — Ambulatory Visit: Payer: 59 | Admitting: Family Medicine

## 2022-05-01 ENCOUNTER — Telehealth: Payer: Self-pay | Admitting: Family Medicine

## 2022-05-01 MED ORDER — DAPAGLIFLOZIN PROPANEDIOL 10 MG PO TABS: 10.0000 mg | ORAL_TABLET | Freq: Every day | ORAL | 3 refills | Status: DC

## 2022-05-01 NOTE — Telephone Encounter (Signed)
Pt Rx for Farxiga 10 mg was faxed to [facility]

## 2022-05-01 NOTE — Telephone Encounter (Signed)
The RX is ready to fax  

## 2022-05-24 ENCOUNTER — Other Ambulatory Visit: Payer: Self-pay | Admitting: Family Medicine

## 2022-05-29 ENCOUNTER — Other Ambulatory Visit: Payer: Self-pay | Admitting: Family Medicine

## 2022-07-17 ENCOUNTER — Other Ambulatory Visit: Payer: Self-pay | Admitting: Family Medicine

## 2022-07-30 ENCOUNTER — Other Ambulatory Visit: Payer: Self-pay | Admitting: Family Medicine

## 2022-07-30 DIAGNOSIS — N529 Male erectile dysfunction, unspecified: Secondary | ICD-10-CM

## 2022-08-18 ENCOUNTER — Encounter: Payer: Self-pay | Admitting: Podiatry

## 2022-08-18 ENCOUNTER — Ambulatory Visit (INDEPENDENT_AMBULATORY_CARE_PROVIDER_SITE_OTHER): Payer: Managed Care, Other (non HMO) | Admitting: Podiatry

## 2022-08-18 DIAGNOSIS — L603 Nail dystrophy: Secondary | ICD-10-CM

## 2022-08-18 DIAGNOSIS — Z13228 Encounter for screening for other metabolic disorders: Secondary | ICD-10-CM | POA: Insufficient documentation

## 2022-08-18 NOTE — Progress Notes (Signed)
Subjective:  Patient ID: Jeffrey Todd, male    DOB: 04/24/1971,  MRN: [id number] HPI Chief Complaint  Patient presents with   Nail Problem    Toenails bilateral - thick, discolored nails x years, skin is dry, peeling, not itchy, tried sprays and lotions   Diabetes    Last A1c was 8.8   New Patient (Initial Visit)    52 y.o. male presents with the above complaint.   ROS: Denies fever chills nausea vomit muscle aches pains calf pain back pain chest pain shortness of breath.  Past Medical History:  Diagnosis Date   Diabetes mellitus, type II    Hemorrhoids    Hyperglyceridemia    Hypertension    Restless leg syndrome    No past surgical history on file.  Current Outpatient Medications:    tretinoin (RETIN-A) 0.025 % cream, Apply topically at bedtime., Disp: , Rfl:    aspirin 81 MG tablet, Take 81 mg by mouth daily., Disp: , Rfl:    atorvastatin (LIPITOR) 10 MG tablet, Take 1 tablet (10 mg total) by mouth daily. *Appointment required for future refills, Disp: 30 tablet, Rfl: 1   benzonatate (TESSALON) 200 MG capsule, Take 1 capsule (200 mg total) by mouth 2 (two) times daily as needed for cough., Disp: 20 capsule, Rfl: 1   dapagliflozin propanediol (FARXIGA) 10 MG TABS tablet, Take 1 tablet (10 mg total) by mouth daily before breakfast., Disp: 90 tablet, Rfl: 3   escitalopram (LEXAPRO) 10 MG tablet, TAKE 1 TABLET BY MOUTH EVERY DAY, Disp: 90 tablet, Rfl: 0   glipiZIDE (GLUCOTROL) 10 MG tablet, Take 1 tablet (10 mg total) by mouth 2 (two) times daily before a meal., Disp: 60 tablet, Rfl: 11   lisinopril (ZESTRIL) 10 MG tablet, TAKE 1 TABLET BY MOUTH EVERY DAY, Disp: 90 tablet, Rfl: 1   magic mouthwash w/lidocaine SOLN, Take 5 mLs by mouth 3 (three) times daily as needed., Disp: 180 mL, Rfl: 0   metFORMIN (GLUCOPHAGE-XR) 750 MG 24 hr tablet, TAKE 1 TABLET BY MOUTH EVERY DAY WITH BREAKFAST, Disp: 90 tablet, Rfl: 0   pramipexole (MIRAPEX) 0.125 MG tablet, TAKE 1 OR 2 TABLETS BY MOUTH  AT NIGHT AS NEEDED FOR RESTLESS LEG SYMPTOMS, Disp: 180 tablet, Rfl: 1   sitaGLIPtin (JANUVIA) 100 MG tablet, Take 1 tablet (100 mg total) by mouth daily., Disp: 90 tablet, Rfl: 3   tadalafil (CIALIS) 20 MG tablet, Take 1 tablet (20 mg total) by mouth daily as needed for erectile dysfunction., Disp: 10 tablet, Rfl: 2   UNABLE TO FIND, Med Name: Brand- Hylands, OTC restless leg medication, Disp: , Rfl:    valACYclovir (VALTREX) 500 MG tablet, TAKE 1 TABLET BY MOUTH TWICE A DAY, Disp: 90 tablet, Rfl: 3  No Known Allergies Review of Systems Objective:  There were no vitals filed for this visit.  General: Well developed, nourished, in no acute distress, alert and oriented x3   Dermatological: Skin is warm, dry and supple bilateral. Nails x 10 are well maintained; remaining integument appears unremarkable at this time. There are no open sores, no preulcerative lesions, tinea pedis bilateral hallux nails are thick yellow dystrophic clinical mycotic.  Vascular: Dorsalis Pedis artery and Posterior Tibial artery pedal pulses are 2/4 bilateral with immedate capillary fill time. Pedal hair growth present. No varicosities and no lower extremity edema present bilateral.   Neruologic: Grossly intact via light touch bilateral. Vibratory intact via tuning fork bilateral. Protective threshold with Semmes Wienstein monofilament intact to all  pedal sites bilateral. Patellar and Achilles deep tendon reflexes 2+ bilateral. No Babinski or clonus noted bilateral.   Musculoskeletal: No gross boney pedal deformities bilateral. No pain, crepitus, or limitation noted with foot and ankle range of motion bilateral. Muscular strength 5/5 in all groups tested bilateral.  Gait: Unassisted, Nonantalgic.    Radiographs:  None taken  Assessment & Plan:   Assessment: Tinea pedis and nail dystrophy possibly onychomycosis  Plan: Skin and nails samples were taken today to be sent for pathologic evaluation.  I started him  on Lamisil 250 mg tablets 1 p.o. daily #30 I will follow-up with him in 1 month for pathology results as well as a 90-day prescription and a requisition for blood work.     Kamiyah Kindel T. [place], [place]

## 2022-08-20 ENCOUNTER — Telehealth: Payer: Self-pay | Admitting: Podiatry

## 2022-08-20 MED ORDER — TERBINAFINE HCL 250 MG PO TABS: 250.0000 mg | ORAL_TABLET | Freq: Every day | ORAL | 0 refills | Status: DC

## 2022-08-20 NOTE — Telephone Encounter (Signed)
Pt states his medication was not sent to pharmacy. Please send medication Lamisil 250 mg tablets 1 p.o. daily #30 to:   [id number] [place], [place] - [address]   Please advise.

## 2022-08-20 NOTE — Addendum Note (Signed)
Addended by: Lottie Rater E on: 08/20/2022 12:06 PM   Modules accepted: Orders

## 2022-08-22 ENCOUNTER — Other Ambulatory Visit: Payer: Self-pay | Admitting: Family Medicine

## 2022-08-27 ENCOUNTER — Other Ambulatory Visit: Payer: Self-pay | Admitting: Family Medicine

## 2022-09-15 ENCOUNTER — Ambulatory Visit: Payer: Managed Care, Other (non HMO) | Admitting: Podiatry

## 2022-09-21 ENCOUNTER — Ambulatory Visit: Payer: 59 | Admitting: Family Medicine

## 2022-09-21 ENCOUNTER — Ambulatory Visit (INDEPENDENT_AMBULATORY_CARE_PROVIDER_SITE_OTHER): Payer: 59 | Admitting: Family Medicine

## 2022-09-21 ENCOUNTER — Encounter: Payer: Self-pay | Admitting: Family Medicine

## 2022-09-21 VITALS — BP 118/80 | HR 70 | Temp 98.0°F | Wt 268.0 lb

## 2022-09-21 DIAGNOSIS — M5441 Lumbago with sciatica, right side: Secondary | ICD-10-CM | POA: Diagnosis not present

## 2022-09-21 MED ORDER — CYCLOBENZAPRINE HCL 10 MG PO TABS: 10.0000 mg | ORAL_TABLET | Freq: Three times a day (TID) | ORAL | 1 refills | Status: AC | PRN

## 2022-09-21 MED ORDER — METHYLPREDNISOLONE 4 MG PO TBPK: ORAL_TABLET | ORAL | 0 refills | Status: DC

## 2022-09-21 NOTE — Progress Notes (Signed)
   Subjective:    Patient ID: Jeffrey Todd, male    DOB: 06-20-1970, 52 y.o.   MRN: [id number]  HPI Here for 2 weeks of an intermittent sharp pain in the right lower back that shoots down the right leg. No weakness or numbness. Taking Tylenol. No recent trauma. He had similar pains in 2015 , and he had an MRI of the lumber spine then that showed a moderate broad-based disc bulge at L4-L5.    Review of Systems  Constitutional: Negative.   Respiratory: Negative.    Cardiovascular: Negative.   Musculoskeletal:  Positive for back pain.       Objective:   Physical Exam Constitutional:      General: He is not in acute distress.    Appearance: Normal appearance.  Cardiovascular:     Rate and Rhythm: Normal rate and regular rhythm.     Pulses: Normal pulses.     Heart sounds: Normal heart sounds.  Pulmonary:     Effort: Pulmonary effort is normal.     Breath sounds: Normal breath sounds.  Musculoskeletal:     Comments: The lower back is not tender and has full ROM. Negative SLR on both sides   Neurological:     Mental Status: He is alert.           Assessment & Plan:  Right lower back pain with sciatica. He will take Flexeril and a Medrol dose pack. Apply heat. Recheck as needed.  Gershon Crane, MD

## 2022-09-22 ENCOUNTER — Encounter: Payer: Self-pay | Admitting: Podiatry

## 2022-09-22 ENCOUNTER — Ambulatory Visit (INDEPENDENT_AMBULATORY_CARE_PROVIDER_SITE_OTHER): Payer: Managed Care, Other (non HMO) | Admitting: Podiatry

## 2022-09-22 DIAGNOSIS — L603 Nail dystrophy: Secondary | ICD-10-CM | POA: Diagnosis not present

## 2022-09-22 DIAGNOSIS — Z79899 Other long term (current) drug therapy: Secondary | ICD-10-CM

## 2022-09-22 MED ORDER — TERBINAFINE HCL 250 MG PO TABS: 250.0000 mg | ORAL_TABLET | Freq: Every day | ORAL | 0 refills | Status: DC

## 2022-09-23 NOTE — Progress Notes (Signed)
Jeffrey Todd presents today for follow-up of his pathology.  He has been on Lamisil for the 30 days and has had no problems taking the medication and states that his skin has cleared up dramatically but there is been no change in the nails as of yet.  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  Assessment: Onychomycosis and tinea pedis.  Plan: At this point requested blood work and wrote for another 90 days of Lamisil I will follow-up with him in 4 months

## 2022-09-26 ENCOUNTER — Other Ambulatory Visit: Payer: Self-pay | Admitting: Family Medicine

## 2022-10-06 ENCOUNTER — Telehealth: Payer: Self-pay | Admitting: Family Medicine

## 2022-10-06 DIAGNOSIS — M5416 Radiculopathy, lumbar region: Secondary | ICD-10-CM

## 2022-10-06 NOTE — Telephone Encounter (Signed)
Pt was last seen on 09/21/22 for sciatic pain.  Pt states pain meds prescribed are not working and he is still in a lot of pain.  Pt is asking if MD can prescribe something else or create a referral to see a specialist?  Please advise.

## 2022-10-07 MED ORDER — HYDROCODONE-ACETAMINOPHEN 10-325 MG PO TABS: 1.0000 | ORAL_TABLET | ORAL | 0 refills | Status: DC | PRN

## 2022-10-07 NOTE — Telephone Encounter (Signed)
I agree with a referral to a specialist, but before we do that we need to get another lumbar MRI scan. I have ordered this, and I also sent in some stronger pain medications

## 2022-10-07 NOTE — Telephone Encounter (Signed)
Spoke with pt advised of Dr Fry recommendation, verbalized understanding 

## 2022-10-12 ENCOUNTER — Encounter: Payer: Self-pay | Admitting: Family Medicine

## 2022-10-16 MED ORDER — HYDROCODONE-ACETAMINOPHEN 10-325 MG PO TABS: 1.0000 | ORAL_TABLET | ORAL | 0 refills | Status: AC | PRN

## 2022-10-16 NOTE — Telephone Encounter (Signed)
[facility] [address]

## 2022-10-28 ENCOUNTER — Encounter: Payer: Self-pay | Admitting: Family Medicine

## 2022-10-31 ENCOUNTER — Other Ambulatory Visit: Payer: Self-pay | Admitting: Family Medicine

## 2022-11-01 ENCOUNTER — Other Ambulatory Visit: Payer: 59

## 2022-11-25 ENCOUNTER — Other Ambulatory Visit: Payer: Self-pay | Admitting: Family Medicine

## 2022-11-27 ENCOUNTER — Other Ambulatory Visit: Payer: Self-pay | Admitting: Family Medicine

## 2022-11-27 DIAGNOSIS — N529 Male erectile dysfunction, unspecified: Secondary | ICD-10-CM

## 2022-12-04 ENCOUNTER — Other Ambulatory Visit: Payer: Self-pay | Admitting: Family Medicine

## 2022-12-10 ENCOUNTER — Ambulatory Visit: Payer: 59 | Admitting: Internal Medicine

## 2022-12-14 ENCOUNTER — Encounter: Payer: Self-pay | Admitting: Family Medicine

## 2022-12-14 ENCOUNTER — Ambulatory Visit (INDEPENDENT_AMBULATORY_CARE_PROVIDER_SITE_OTHER): Payer: 59 | Admitting: Family Medicine

## 2022-12-14 VITALS — BP 130/80 | HR 90 | Temp 98.3°F | Wt 269.0 lb

## 2022-12-14 DIAGNOSIS — N453 Epididymo-orchitis: Secondary | ICD-10-CM

## 2022-12-14 MED ORDER — DOXYCYCLINE HYCLATE 100 MG PO CAPS: 100.0000 mg | ORAL_CAPSULE | Freq: Two times a day (BID) | ORAL | 0 refills | Status: AC

## 2022-12-14 NOTE — Progress Notes (Signed)
   Subjective:    Patient ID: Jeffrey Todd, male    DOB: Sep 01, 1970, 52 y.o.   MRN: [id number]  HPI Here for 2 weeks of intermittent pain in both testicles. No recent trauma. No swelling or fever. No urinary symptoms. He notes that 2 weeks before this started he went in a hot tub while he was on vacation at the beach.    Review of Systems  Constitutional: Negative.   Respiratory: Negative.    Cardiovascular: Negative.   Genitourinary:  Positive for testicular pain. Negative for difficulty urinating, dysuria, flank pain, hematuria, penile discharge and urgency.       Objective:   Physical Exam Constitutional:      Appearance: Normal appearance.  Cardiovascular:     Rate and Rhythm: Normal rate and regular rhythm.     Pulses: Normal pulses.     Heart sounds: Normal heart sounds.  Pulmonary:     Effort: Pulmonary effort is normal.     Breath sounds: Normal breath sounds.  Genitourinary:    Penis: Normal.      Testes: Normal.  Neurological:     Mental Status: He is alert.           Assessment & Plan:  Epididymitis, treat with 10 days of Doxycycline. At his request we will screen for STD's.  Gershon Crane, MD

## 2023-01-02 ENCOUNTER — Ambulatory Visit
Admission: RE | Admit: 2023-01-02 | Discharge: 2023-01-02 | Disposition: A | Discharge status: Home / Self Care | Payer: 59 | Source: Ambulatory Visit | Attending: Family Medicine | Admitting: Family Medicine

## 2023-01-02 DIAGNOSIS — M5416 Radiculopathy, lumbar region: Secondary | ICD-10-CM

## 2023-01-07 ENCOUNTER — Other Ambulatory Visit: Payer: Self-pay | Admitting: Family Medicine

## 2023-01-09 ENCOUNTER — Other Ambulatory Visit: Payer: Self-pay | Admitting: Family Medicine

## 2023-03-01 ENCOUNTER — Other Ambulatory Visit: Payer: Self-pay | Admitting: Podiatry

## 2023-03-04 ENCOUNTER — Other Ambulatory Visit: Payer: Self-pay | Admitting: Family Medicine

## 2023-03-23 ENCOUNTER — Ambulatory Visit: Payer: Managed Care, Other (non HMO) | Admitting: Podiatry

## 2023-03-31 ENCOUNTER — Other Ambulatory Visit: Payer: Self-pay | Admitting: Family Medicine

## 2023-04-21 ENCOUNTER — Ambulatory Visit: Payer: Managed Care, Other (non HMO) | Admitting: Podiatry

## 2023-04-29 ENCOUNTER — Other Ambulatory Visit: Payer: Self-pay | Admitting: Family Medicine

## 2023-04-29 DIAGNOSIS — N529 Male erectile dysfunction, unspecified: Secondary | ICD-10-CM

## 2023-05-01 ENCOUNTER — Other Ambulatory Visit: Payer: Self-pay | Admitting: Family Medicine

## 2023-05-24 ENCOUNTER — Ambulatory Visit (INDEPENDENT_AMBULATORY_CARE_PROVIDER_SITE_OTHER): Payer: Managed Care, Other (non HMO) | Admitting: Podiatry

## 2023-05-24 DIAGNOSIS — Z91199 Patient's noncompliance with other medical treatment and regimen due to unspecified reason: Secondary | ICD-10-CM

## 2023-05-26 NOTE — Progress Notes (Signed)
   Complete physical exam  Patient: Jeffrey Todd   DOB: 02/21/1997   53 y.o. Male  MRN: [id number]  Subjective:    No chief complaint on file.   Jeffrey Todd is a 53 y.o. male who presents today for a complete physical exam. She reports consuming a {diet types:17450} diet. {types:19826} She generally feels {DESC; WELL/FAIRLY WELL/POORLY:18703}. She reports sleeping {DESC; WELL/FAIRLY WELL/POORLY:18703}. She {does/does not:200015} have additional problems to discuss today.    Most recent fall risk assessment:    10/29/2021   10:42 AM  Fall Risk   Falls in the past year? 0  Number falls in past yr: 0  Injury with Fall? 0  Risk for fall due to : No Fall Risks  Follow up Falls evaluation completed     Most recent depression screenings:    10/29/2021   10:42 AM 09/19/2020   10:46 AM  PHQ 2/9 Scores  PHQ - 2 Score 0 0  PHQ- 9 Score 5     {VISON DENTAL STD PSA (Optional):27386}  {History (Optional):23778}  Patient Care Team: Jessup, Joy, NP as PCP - General (Nurse Practitioner)   Outpatient Medications Prior to Visit  Medication Sig   fluticasone (FLONASE) 50 MCG/ACT nasal spray Place 2 sprays into both nostrils in the morning and at bedtime. After 7 days, reduce to once daily.   norgestimate-ethinyl estradiol (SPRINTEC 28) 0.25-35 MG-MCG tablet Take 1 tablet by mouth daily.   Nystatin POWD Apply liberally to affected area 2 times per day   spironolactone (ALDACTONE) 100 MG tablet Take 1 tablet (100 mg total) by mouth daily.   No facility-administered medications prior to visit.    ROS        Objective:     There were no vitals taken for this visit. {Vitals History (Optional):23777}  Physical Exam   No results found for any visits on 12/04/21. {Show previous labs (optional):23779}    Assessment & Plan:    Routine Health Maintenance and Physical Exam  Immunization History  Administered Date(s) Administered   DTaP 05/07/1999, 07/03/1999,  09/11/1999, 05/27/2000, 12/11/2003   Hepatitis A 10/07/2007, 10/12/2008   Hepatitis B 02/22/1999, 04/01/1999, 09/11/1999   HiB (PRP-OMP) 05/07/1999, 07/03/1999, 09/11/1999, 05/27/2000   IPV 05/07/1999, 07/03/1999, 03/01/2000, 12/11/2003   Influenza,inj,Quad PF,6+ Mos 01/12/2014   Influenza-Unspecified 04/13/2012   MMR 03/01/2001, 12/11/2003   Meningococcal Polysaccharide 10/12/2011   Pneumococcal Conjugate-13 05/27/2000   Pneumococcal-Unspecified 09/11/1999, 11/25/1999   Tdap 10/12/2011   Varicella 03/01/2000, 10/07/2007    Health Maintenance  Topic Date Due   HIV Screening  Never done   Hepatitis C Screening  Never done   INFLUENZA VACCINE  12/02/2021   PAP-Cervical Cytology Screening  12/04/2021 (Originally 02/21/2020)   PAP SMEAR-Modifier  12/04/2021 (Originally 02/21/2020)   TETANUS/TDAP  12/04/2021 (Originally 10/11/2021)   HPV VACCINES  Discontinued   COVID-19 Vaccine  Discontinued    Discussed health benefits of physical activity, and encouraged her to engage in regular exercise appropriate for her age and condition.  Problem List Items Addressed This Visit   None Visit Diagnoses     Annual physical exam    -  Primary   Cervical cancer screening       Need for Tdap vaccination          No follow-ups on file.     Joy Jessup, NP   

## 2023-05-31 ENCOUNTER — Other Ambulatory Visit: Payer: Self-pay | Admitting: Family Medicine

## 2023-05-31 DIAGNOSIS — N529 Male erectile dysfunction, unspecified: Secondary | ICD-10-CM

## 2023-06-02 ENCOUNTER — Other Ambulatory Visit: Payer: Self-pay | Admitting: Family Medicine

## 2023-06-28 ENCOUNTER — Other Ambulatory Visit: Payer: Self-pay | Admitting: Family Medicine

## 2023-07-28 ENCOUNTER — Other Ambulatory Visit: Payer: Self-pay | Admitting: Family Medicine

## 2023-07-28 DIAGNOSIS — N529 Male erectile dysfunction, unspecified: Secondary | ICD-10-CM

## 2023-08-01 ENCOUNTER — Other Ambulatory Visit: Payer: Self-pay | Admitting: Family Medicine

## 2023-08-03 ENCOUNTER — Encounter: Admitting: Family Medicine

## 2023-08-10 ENCOUNTER — Other Ambulatory Visit: Payer: Self-pay | Admitting: Family Medicine

## 2023-08-10 ENCOUNTER — Ambulatory Visit (INDEPENDENT_AMBULATORY_CARE_PROVIDER_SITE_OTHER): Admitting: Family Medicine

## 2023-08-10 ENCOUNTER — Encounter: Payer: Self-pay | Admitting: Family Medicine

## 2023-08-10 VITALS — BP 128/78 | HR 84 | Temp 98.2°F | Wt 269.0 lb

## 2023-08-10 DIAGNOSIS — N529 Male erectile dysfunction, unspecified: Secondary | ICD-10-CM | POA: Diagnosis not present

## 2023-08-10 DIAGNOSIS — Z0000 Encounter for general adult medical examination without abnormal findings: Secondary | ICD-10-CM

## 2023-08-10 DIAGNOSIS — E1169 Type 2 diabetes mellitus with other specified complication: Secondary | ICD-10-CM | POA: Diagnosis not present

## 2023-08-10 DIAGNOSIS — Z23 Encounter for immunization: Secondary | ICD-10-CM

## 2023-08-10 DIAGNOSIS — Z209 Contact with and (suspected) exposure to unspecified communicable disease: Secondary | ICD-10-CM | POA: Diagnosis not present

## 2023-08-10 MED ORDER — DAPAGLIFLOZIN PROPANEDIOL 10 MG PO TABS: 10.0000 mg | ORAL_TABLET | Freq: Every day | ORAL | 3 refills | Status: AC

## 2023-08-10 MED ORDER — ESCITALOPRAM OXALATE 10 MG PO TABS
10.0000 mg | ORAL_TABLET | Freq: Every day | ORAL | 3 refills | Status: AC
Start: 2023-08-10 — End: ?

## 2023-08-10 MED ORDER — TADALAFIL 20 MG PO TABS: 20.0000 mg | ORAL_TABLET | Freq: Every day | ORAL | 11 refills | Status: AC | PRN

## 2023-08-10 MED ORDER — SITAGLIPTIN PHOSPHATE 100 MG PO TABS: 100.0000 mg | ORAL_TABLET | Freq: Every day | ORAL | 3 refills | Status: DC

## 2023-08-10 MED ORDER — VALACYCLOVIR HCL 500 MG PO TABS: 500.0000 mg | ORAL_TABLET | Freq: Two times a day (BID) | ORAL | 3 refills | Status: DC

## 2023-08-10 MED ORDER — ATORVASTATIN CALCIUM 10 MG PO TABS
10.0000 mg | ORAL_TABLET | Freq: Every day | ORAL | 3 refills | Status: AC
Start: 2023-08-10 — End: ?

## 2023-08-10 MED ORDER — METFORMIN HCL ER 750 MG PO TB24: ORAL_TABLET | ORAL | 3 refills | Status: AC

## 2023-08-10 MED ORDER — LISINOPRIL 10 MG PO TABS: 10.0000 mg | ORAL_TABLET | Freq: Every day | ORAL | 3 refills | Status: AC

## 2023-08-10 NOTE — Progress Notes (Signed)
 Subjective:    Patient ID: Jeffrey Todd, male    DOB: 02-20-1971, 53 y.o.   MRN: [id number]  HPI Here for a well exam. He feels fine. His BP has been stable. His glucoses at home have ranged from 90 to 200.    Review of Systems  Constitutional: Negative.   HENT: Negative.    Eyes: Negative.   Respiratory: Negative.    Cardiovascular: Negative.   Gastrointestinal: Negative.   Genitourinary: Negative.   Musculoskeletal: Negative.   Skin: Negative.   Neurological: Negative.   Psychiatric/Behavioral: Negative.         Objective:   Physical Exam Constitutional:      General: He is not in acute distress.    Appearance: Normal appearance. He is well-developed. He is not diaphoretic.  HENT:     Head: Normocephalic and atraumatic.     Right Ear: External ear normal.     Left Ear: External ear normal.     Nose: Nose normal.     Mouth/Throat:     Pharynx: No oropharyngeal exudate.  Eyes:     General: No scleral icterus.       Right eye: No discharge.        Left eye: No discharge.     Conjunctiva/sclera: Conjunctivae normal.     Pupils: Pupils are equal, round, and reactive to light.  Neck:     Thyroid: No thyromegaly.     Vascular: No JVD.     Trachea: No tracheal deviation.  Cardiovascular:     Rate and Rhythm: Normal rate and regular rhythm.     Pulses: Normal pulses.     Heart sounds: Normal heart sounds. No murmur heard.    No friction rub. No gallop.  Pulmonary:     Effort: Pulmonary effort is normal. No respiratory distress.     Breath sounds: Normal breath sounds. No wheezing or rales.  Chest:     Chest wall: No tenderness.  Abdominal:     General: Bowel sounds are normal. There is no distension.     Palpations: Abdomen is soft. There is no mass.     Tenderness: There is no abdominal tenderness. There is no guarding or rebound.  Genitourinary:    Penis: Normal. No tenderness.      Testes: Normal.     Prostate: Normal.     Rectum: Normal. Guaiac result  negative.  Musculoskeletal:        General: No tenderness. Normal range of motion.     Cervical back: Neck supple.  Lymphadenopathy:     Cervical: No cervical adenopathy.  Skin:    General: Skin is warm and dry.     Coloration: Skin is not pale.     Findings: No erythema or rash.  Neurological:     General: No focal deficit present.     Mental Status: He is alert and oriented to person, place, and time.     Cranial Nerves: No cranial nerve deficit.     Motor: No abnormal muscle tone.     Coordination: Coordination normal.     Deep Tendon Reflexes: Reflexes are normal and symmetric. Reflexes normal.  Psychiatric:        Mood and Affect: Mood normal.        Behavior: Behavior normal.        Thought Content: Thought content normal.        Judgment: Judgment normal.           Assessment &  Plan:  Well exam. We discussed diet and exercise. Get fasting labs. Set up his first colonoscopy.  Gershon Crane, MD

## 2023-08-10 NOTE — Addendum Note (Signed)
 Addended by: Carola Rhine on: 08/10/2023 04:21 PM   Modules accepted: Orders

## 2023-08-11 ENCOUNTER — Other Ambulatory Visit (INDEPENDENT_AMBULATORY_CARE_PROVIDER_SITE_OTHER)

## 2023-08-11 DIAGNOSIS — Z13220 Encounter for screening for lipoid disorders: Secondary | ICD-10-CM | POA: Diagnosis not present

## 2023-08-11 DIAGNOSIS — Z125 Encounter for screening for malignant neoplasm of prostate: Secondary | ICD-10-CM | POA: Diagnosis not present

## 2023-08-11 DIAGNOSIS — Z0000 Encounter for general adult medical examination without abnormal findings: Secondary | ICD-10-CM

## 2023-08-11 DIAGNOSIS — Z131 Encounter for screening for diabetes mellitus: Secondary | ICD-10-CM | POA: Diagnosis not present

## 2023-08-12 LAB — HEPATIC FUNCTION PANEL
ALT: 36 U/L (ref 0–53)
AST: 20 U/L (ref 0–37)
Albumin: 4.4 g/dL (ref 3.5–5.2)
Alkaline Phosphatase: 80 U/L (ref 39–117)
Bilirubin, Direct: 0.1 mg/dL (ref 0.0–0.3)
Total Bilirubin: 0.4 mg/dL (ref 0.2–1.2)
Total Protein: 7.5 g/dL (ref 6.0–8.3)

## 2023-08-12 LAB — PSA: PSA: 1.54 ng/mL (ref 0.10–4.00)

## 2023-08-12 LAB — BASIC METABOLIC PANEL WITH GFR
BUN: 11 mg/dL (ref 6–23)
CO2: 26 meq/L (ref 19–32)
Calcium: 9.2 mg/dL (ref 8.4–10.5)
Chloride: 100 meq/L (ref 96–112)
Creatinine, Ser: 1.14 mg/dL (ref 0.40–1.50)
GFR: 75.01 mL/min (ref >60.00)
Glucose, Bld: 99 mg/dL (ref 70–99)
Potassium: 4 meq/L (ref 3.5–5.1)
Sodium: 137 meq/L (ref 135–145)

## 2023-08-12 LAB — LIPID PANEL
Cholesterol: 139 mg/dL (ref 0–200)
HDL: 38.9 mg/dL — ABNORMAL LOW (ref >39.00)
LDL Cholesterol: 87 mg/dL (ref 0–99)
NonHDL: 99.94
Total CHOL/HDL Ratio: 4
Triglycerides: 66 mg/dL (ref 0.0–149.0)
VLDL: 13.2 mg/dL (ref 0.0–40.0)

## 2023-08-12 LAB — CBC WITH DIFFERENTIAL/PLATELET
Basophils Absolute: 0 10*3/uL (ref 0.0–0.1)
Basophils Relative: 0.5 % (ref 0.0–3.0)
Eosinophils Absolute: 0.2 10*3/uL (ref 0.0–0.7)
Eosinophils Relative: 2 % (ref 0.0–5.0)
HCT: 43.1 % (ref 39.0–52.0)
Hemoglobin: 14.4 g/dL (ref 13.0–17.0)
Lymphocytes Relative: 36.9 % (ref 12.0–46.0)
Lymphs Abs: 2.9 10*3/uL (ref 0.7–4.0)
MCHC: 33.3 g/dL (ref 30.0–36.0)
MCV: 93.9 fl (ref 78.0–100.0)
Monocytes Absolute: 0.9 10*3/uL (ref 0.1–1.0)
Monocytes Relative: 11.4 % (ref 3.0–12.0)
Neutro Abs: 3.8 10*3/uL (ref 1.4–7.7)
Neutrophils Relative %: 49.2 % (ref 43.0–77.0)
Platelets: 305 10*3/uL (ref 150.0–400.0)
RBC: 4.59 Mil/uL (ref 4.22–5.81)
RDW: 13.5 % (ref 11.5–15.5)
WBC: 7.8 10*3/uL (ref 4.0–10.5)

## 2023-08-12 LAB — VITAMIN B12: Vitamin B-12: 314 pg/mL (ref 211–911)

## 2023-08-12 LAB — TSH: TSH: 1.09 u[IU]/mL (ref 0.35–5.50)

## 2023-08-12 LAB — HEMOGLOBIN A1C: Hgb A1c MFr Bld: 7.8 % — ABNORMAL HIGH (ref 4.6–6.5)

## 2023-08-13 ENCOUNTER — Telehealth: Payer: Self-pay | Admitting: *Deleted

## 2023-08-13 NOTE — Telephone Encounter (Signed)
 Copied from CRM [id number]. Topic: Clinical - Medication Question >> Aug 13, 2023  4:30 PM Adaysia C wrote: Reason for CRM: Patient has requested for a prior authorization for Ozempic to be submitted to his insurance; patient spoke with his insurance and they said they will approve him for Ozempic but a prior auth has to be submitted; please follow up with patient after prior Berkley Harvey has been submitted [phone]

## 2023-08-16 ENCOUNTER — Other Ambulatory Visit (HOSPITAL_COMMUNITY): Payer: Self-pay

## 2023-08-16 ENCOUNTER — Telehealth: Payer: Self-pay

## 2023-08-16 MED ORDER — OZEMPIC (0.25 OR 0.5 MG/DOSE) 2 MG/3ML ~~LOC~~ SOPN: 0.2500 mg | PEN_INJECTOR | SUBCUTANEOUS | 2 refills | Status: AC

## 2023-08-16 NOTE — Telephone Encounter (Signed)
 PA request has been Submitted. New Encounter has been or will be created for follow up. For additional info see Pharmacy Prior Auth telephone encounter from 08/16/23.

## 2023-08-16 NOTE — Telephone Encounter (Signed)
 Pharmacy Patient Advocate Encounter   Received notification from Pt Calls Messages that prior authorization for OZEMPIC is required/requested.   Insurance verification completed.   The patient is insured through  [employer]  .   Per test claim: PA required; PA submitted to above mentioned insurance via Prompt PA Key/confirmation #/EOC [id number] Status is pending

## 2023-08-16 NOTE — Telephone Encounter (Signed)
 I stopped the Januvia, and I sent in a RX for Ozempic to get the PA process started

## 2023-08-16 NOTE — Telephone Encounter (Signed)
 Please send a PA for Ozempic for this pt

## 2023-08-20 NOTE — Telephone Encounter (Signed)
 Patient called back and explained that [facility] had [facility] However, now [facility]. Please advise.

## 2023-08-23 ENCOUNTER — Other Ambulatory Visit (HOSPITAL_COMMUNITY): Payer: Self-pay

## 2023-08-23 NOTE — Telephone Encounter (Signed)
 Checked status of PA on promptpa. PA is in progress.

## 2023-08-24 ENCOUNTER — Telehealth: Payer: Self-pay

## 2023-08-24 NOTE — Telephone Encounter (Signed)
 Copied from CRM [id number]. Topic: Clinical - Medication Question >> Aug 18, 2023  5:00 PM Star East wrote: Reason for CRM: checking status of ozempic  refill >> Aug 24, 2023 12:32 PM Allyne Areola wrote: Patient is calling to follow up on the prior authorization status for the Ozempic  prescription.

## 2023-08-25 NOTE — Telephone Encounter (Signed)
 Checked status. PA is still pending.

## 2023-08-25 NOTE — Telephone Encounter (Signed)
 PA sent to pt plan on 08/24/23, Awaiting PA approval

## 2023-08-25 NOTE — Telephone Encounter (Signed)
 Pt is aware that the office will call him with the update

## 2023-08-26 ENCOUNTER — Other Ambulatory Visit (HOSPITAL_COMMUNITY): Payer: Self-pay

## 2023-08-26 NOTE — Telephone Encounter (Signed)
 Noted.

## 2023-08-31 ENCOUNTER — Encounter: Payer: Self-pay | Admitting: Gastroenterology

## 2023-09-02 ENCOUNTER — Other Ambulatory Visit (HOSPITAL_COMMUNITY): Payer: Self-pay

## 2023-09-02 NOTE — Telephone Encounter (Signed)
 Called insurance to check status of PA. Per representative, additional information is required. Will complete and fax back once received.

## 2023-09-02 NOTE — Telephone Encounter (Signed)
 Additional information has been requested from the patient's insurance in order to proceed with the prior authorization request. Requested information has been sent, or form has been filled out and faxed back to [phone].

## 2023-09-07 NOTE — Telephone Encounter (Signed)
 Noted.

## 2023-09-08 NOTE — Telephone Encounter (Signed)
 Called insurance to check status of PA. PA is still pending. They have received the additional clinical questions requested. Per representative, determination should be made within the next 2 to 3 days.    Phone: [phone]

## 2023-09-13 ENCOUNTER — Other Ambulatory Visit (HOSPITAL_COMMUNITY): Payer: Self-pay

## 2023-09-13 NOTE — Telephone Encounter (Signed)
 Called the insurance to check the status of the PA for the Ozempic .  The status is still pending.   Per the rep, she is going to email the team to expedite the PA over for review.   Phone# [phone]

## 2023-09-17 ENCOUNTER — Other Ambulatory Visit (HOSPITAL_COMMUNITY): Payer: Self-pay

## 2023-09-17 NOTE — Telephone Encounter (Signed)
 Pharmacy Patient Advocate Encounter  Received notification from [employer] that Prior Authorization for OZEMPIC  has been APPROVED from 09/16/23 to 09/16/24. Ran test claim, Copay is $195.29. This test claim was processed through [facility]- copay amounts may vary at other pharmacies due to pharmacy/plan contracts, or as the patient moves through the different stages of their insurance plan.   PA #/Case ID/Reference #: [id number]

## 2023-09-17 NOTE — Telephone Encounter (Signed)
 Per tech at [facility] the [facility]  Sp[facility] with [facility] and informed her of the approval information as below.  Shelagh Derrick stated the Rx will be ordered and they will contact the patient when the Rx is ready for pick up.

## 2023-09-17 NOTE — Telephone Encounter (Signed)
 Called insurance to check status. Per representative, PA is in review. She will email the team to expedite the PA.   Phone# [phone]

## 2023-09-24 ENCOUNTER — Encounter

## 2023-09-29 ENCOUNTER — Ambulatory Visit (INDEPENDENT_AMBULATORY_CARE_PROVIDER_SITE_OTHER): Payer: Self-pay | Admitting: Family Medicine

## 2023-09-29 ENCOUNTER — Encounter: Payer: Self-pay | Admitting: Family Medicine

## 2023-09-29 VITALS — BP 118/70 | HR 80 | Temp 98.6°F | Wt 257.0 lb

## 2023-09-29 DIAGNOSIS — R109 Unspecified abdominal pain: Secondary | ICD-10-CM

## 2023-09-29 DIAGNOSIS — R112 Nausea with vomiting, unspecified: Secondary | ICD-10-CM | POA: Diagnosis not present

## 2023-09-29 DIAGNOSIS — R140 Abdominal distension (gaseous): Secondary | ICD-10-CM | POA: Diagnosis not present

## 2023-09-29 MED ORDER — ONDANSETRON 4 MG PO TBDP: 4.0000 mg | ORAL_TABLET | Freq: Four times a day (QID) | ORAL | 1 refills | Status: AC | PRN

## 2023-09-29 NOTE — Progress Notes (Signed)
   Subjective:    Patient ID: Jeffrey Todd, male    DOB: 1970/05/05, 53 y.o.   MRN: [id number]  HPI Here for the onset 4 days ago of intermittent abdominal cramps, diarrhea, bloating, nausea and vomiting, and heartburn. No fever. No recent travelling. He is drinking fluids. Of note he took his first Ozempic  shot 3 days before these symptoms began. Today he feels a little better.    Review of Systems  Constitutional: Negative.   Respiratory: Negative.    Cardiovascular: Negative.   Gastrointestinal:  Positive for abdominal distention, abdominal pain, diarrhea, nausea and vomiting. Negative for blood in stool and constipation.  Genitourinary: Negative.        Objective:   Physical Exam Constitutional:      Appearance: Normal appearance. He is not ill-appearing.  Cardiovascular:     Rate and Rhythm: Normal rate and regular rhythm.     Pulses: Normal pulses.     Heart sounds: Normal heart sounds.  Pulmonary:     Effort: Pulmonary effort is normal.     Breath sounds: Normal breath sounds.  Abdominal:     General: Abdomen is flat. Bowel sounds are normal. There is no distension.     Palpations: Abdomen is soft. There is no mass.     Tenderness: There is no right CVA tenderness, left CVA tenderness, guarding or rebound.     Hernia: No hernia is present.     Comments: Mildly tender diffusely   Neurological:     Mental Status: He is alert.           Assessment & Plan:  This could be symptoms for a  viral enteritis, but they are most likely side effects of the Ozempic . We agreed that he will give himself a second shot tomorrow, and we will see how this goes. If these symptoms continue, we will stop the Ozempic . He can use Zofran and Imodium as needed.  Corita Diego, MD

## 2023-10-15 ENCOUNTER — Encounter: Admitting: Gastroenterology

## 2023-11-09 ENCOUNTER — Ambulatory Visit: Payer: Self-pay

## 2024-08-11 ENCOUNTER — Encounter: Payer: Self-pay | Admitting: Family Medicine
